# Patient Record
Sex: Female | Born: 1990 | Race: Black or African American | Hispanic: No | Marital: Single
Health system: Midwestern US, Community
[De-identification: ages and names within clinical notes are randomized; demographics above are authoritative.]

## PROBLEM LIST (undated history)

## (undated) DIAGNOSIS — O24419 Gestational diabetes mellitus in pregnancy, unspecified control: Secondary | ICD-10-CM

## (undated) HISTORY — DX: Gestational diabetes mellitus in pregnancy, unspecified control: O24.419

---
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
## 2010-09-21 NOTE — ED Provider Notes (Signed)
KNOWN ALLERGIES   NKDA   Seasonal (Unconfirmed)       TRIAGE Sheral Flow Sep 21, 2010 09:47 KPC0)   PATIENT: NAME: [name], AGE: 20, GENDER: female, DOB:         Thu 1991/03/03, TIME OF GREET: Mon Sep 21, 2010 09:46, LANGUAGE:         [place], [place]: [id number], KG WEIGHT: 49.9, HEIGHT: 167cm, MEDICAL         Gill NUMBER: [phone], ACCOUNT NUMBER: [id number], PCP: Doctor         Rene Paci,. [facility] Sep 21, 2010 09:47 KPC0)   ADMISSION: URGENCY: 3, AMBULANCE: [place] [facility] 1,         TRANSPORT: Ambulance, DEPT: Emergency, BED: 2ED 35. (Mon Sep 21, 2010         09:47 KPC0)   VITAL SIGNS: BP 110/58, (Lying), Pulse 80, Resp 18, Temp 99.1,         (Oral), Pain 6, O2 Sat 99, on Room air, Time 09/21/2010 09:47. (09:47         KPC0)   COMPLAINT:  Medic With Abd Pain. (Mon Sep 21, 2010 09:47         KPC0)   PRESENTING COMPLAINT:  Epigastric abdominal pain, Since Today.         (09:49 KPC0)   PAIN: Patient complains of pain, Pain described as aching, On a         scale 0-10 patient rates pain as 6, Epigastric, Pain is constant.         (09:49 KPC0)   LMP: Last menstrual period: 08-26-2010, Pt not on birth control.         (09:49 KPC0)   TREATMENT PRIOR TO ARRIVAL: None. (09:49 KPC0)   TB SCREENING: TB screen negative for this patient. (09:49         KPC0)   ABUSE SCREENING: Patient denies physical abuse or threats. (09:49         KPC0)   FALL RISK: Patient has a low risk of falling. (09:49 KPC0)   SUICIDAL IDEATION: Suicidal ideation is not present. (09:49         KPC0)   ADVANCE DIRECTIVES: Patient does not have advance directives,         Triage assessment performed. (09:49 KPC0)   PROVIDERS: TRIAGE NURSE: Cecil Cranker, RN. [facility] Sep 21, 2010 09:47 KPC0)   PREVIOUS VISIT ALLERGIES: Nkda, Seasonal. (Mon Sep 21, 2010 09:47         KPC0)       AMBULANCE (09:32 CML2)   AMBULANCE: Ambulance: Mon Sep 21, 2010 09:32.       CURRENT MEDICATIONS (09:47 KPC0)   Patient not taking meds       ORDERS    One Click Abdominal Pain:  Ordered for: Truddie Crumble, MD, Rob         Status: Done by Ephriam Knuckles, RN, Tomie China Sep 21, 2010 09:59.         (09:50 KPC0)   Urine HCG:  Ordered for: Truddie Crumble, MD, Rob         Status: Done by Ephriam Knuckles RN, Tomie China Sep 21, 2010 10:00.         (09:50 KPC0)   Urine dip (send for lab U/A if positive):  Ordered for:         Truddie Crumble, MD, Rob         Status: Done by Ephriam Knuckles, RN, Tomie China Sep 21, 2010 09:59.         (09:50 KPC0)   COMPREHENSIVE METABOLIC PANEL:  Ordered for: Truddie Crumble, MD, Rob         Status: Done by System Mon Sep 21, 2010 11:17. (10:34 ADB2)   LIPASE:  Ordered for: Truddie Crumble, MD, Rob         Status: Done by System Mon Sep 21, 2010 11:17. (10:34 ADB2)   CBC, AUTOMATED DIFFERENTIAL:  Ordered for: Truddie Crumble, MD, Rob         Status: Done by System Mon Sep 21, 2010 11:13. (10:34 ADB2)       NURSING ASSESSMENT: ABDOMEN (09:49 KPC0)   CONSTITUTIONAL PED: Complex assessment performed, Patient         arrives, via [facility], Chief complaint: Abdominal         pain, Skin warm, and dry, and normal in color.   PAIN: aching pain, to the epigastric region, Onset of pain This         morning, constant, on a scale 0-10 patient rates pain as 6.   ABDOMEN: Abdomen assessment findings include abdomen symmetrical,         Abdomen soft, tender, to the epigastric region, no pulsatile mass, no         associated nausea, no associated vomiting, Associated with diarrhea.   NOTES: Emotional support needed and given.   SAFETY: Side rails up, Cart/Stretcher in lowest position, Call         light within reach, Hospital ID band on.       NURSING PROCEDURE: DISCHARGE NOTE (11:34 KPC0)   DISCHARGE: Patient discharged to home, ambulating without         assistance, driving self, accompanied by husband/wife/partner,         Discharge instructions given to patient, Simple or moderate discharge         teaching performed, Above person(s) verbalized understanding of          discharge instructions and follow-up care.       DIAGNOSIS (11:24 ADB2)   FINAL: PRIMARY: Abdominal pain - epigastric.       DISPOSITION   PATIENT:  Disposition Type: Discharged, Disposition: Discharged,         Condition: Stable. (11:24 ADB2)      IV Infusion: N/A, Disposition Transport: Family/Friend drive, Patient         left the department. (11:42 KPC0)       INSTRUCTION (11:24 ADB2)   DISCHARGE:  ABDOMINAL PAIN [facility]).   FOLLOWUP:  [facility], , MEDICINE, Rayetta Humphrey,         MEDICINE, [address] [place] [address], [phone].   SPECIAL:  Follow up with primary care physician          Encourage plenty of fluids         Return to the ER if condition worsens or new symptoms develop.   Key:     ADB2=Blalock, PA-C, Morrie Sheldon  CML2=[place], RN, [employer]  KPC0=Christian, RN,     Natalia Leatherwood

## 2010-09-21 NOTE — ED Provider Notes (Signed)
[facility]   EMERGENCY DEPARTMENT TREATMENT REPORT   NAME:  Gill, Paula   SEX:   F   ADMIT: 09/21/2010   DOB:   1991/01/09   MR#    [id number]   ROOM:     TIME SEEN: 02 20 PM   ACCT#  [id number]               DATE OF SERVICE:    09/21/2010.       TIME OF EVALUATION:    0950.       CHIEF COMPLAINT:   Medic, with abdominal pain.       HISTORY OF PRESENT ILLNESS:   The patient is a 20 year old female brought in by medic from home with    complaint of constant sharp epigastric abdominal pain which started a few    hours ago around 0900 this morning.  The patient denies any exacerbating or    alleviating factors of the abdominal pain.  The patient notes she had some    diarrhea yesterday evening, approximately 2 episodes with no noted blood.  The    patient denies any associated fever, chills, nausea, vomiting.  The patient    notes last menstrual cycle was 06/20.  Menstrual cycle is normal occurring    once monthly.  The patient denies any urinary symptoms including dysuria,    urinary frequency or any abnormal vaginal discharge at this time.  The patient    notes that her abdominal pain has improved since her arrival to the ED and    then subsiding upon my evaluation.  The patient notes she has a history of    C-section, otherwise she is generally healthy.       REVIEW OF SYSTEMS:   CONSTITUTIONAL:  No fever or chills.   EYES: No visual symptoms.    ENT: No sore throat, runny nose or other URI symptoms.    RESPIRATORY:  No cough, shortness of breath, or wheezing.    CARDIOVASCULAR:  No chest pain, chest pressure, or palpitations.    GASTROINTESTINAL:  Epigastric abdominal pain, the patient is improving upon    arrival to the ED.  It started this morning and no associated nausea,    vomiting.  Report of diarrhea yesterday evening, normal bowel movement this    morning.   GENITOURINARY:  No dysuria, no frequency or abnormal vaginal discharge.   MUSCULOSKELETAL:  No body aches.   INTEGUMENTARY:  No rashes.    NEUROLOGIC:  No headaches.       PAST MEDICAL HISTORY:   History of C-section.       CURRENT MEDICATIONS:   Reviewed on Ibex.       FAMILY HISTORY:   Noncontributory to this case.       PHYSICAL EXAMINATION:   VITAL SIGNS:  Blood pressure is 110/58, pulse 80, respirations 18, temperature    99.1 orally.  Pain rated 6/10, O2 saturation 99% on room air.   GENERAL APPEARANCE:  Well-developed, well-nourished, in no distress.  The    patient is resting comfortably on examining bed as you enter the room.   HEENT: Eyes:  Conjunctivae clear, lids normal.  Pupils equal, symmetrical, and    normally reactive.   Mouth/Throat:  Surfaces of the pharynx, palate, and    tongue are pink, moist, and without lesions.      NECK:  Supple, nontender, symmetrical, no masses or JVD, trachea midline.     Thyroid not enlarged, nodular, or tender.  LYMPHATICS:  No cervical or submandibular lymphadenopathy palpated.    RESPIRATORY:  Clear and equal breath sounds.  No respiratory distress,    tachypnea, or accessory muscle use.    CARDIOVASCULAR:  Heart regular, without murmurs, gallops, rubs, or thrills.      DP pulses 2+ and equal bilaterally. No peripheral edema or significant    varicosities.    GI:  Abdomen soft, nontender, without complaint of pain to palpation.  No    hepatomegaly or splenomegaly. No abdominal or inguinal masses appreciated by    inspection or palpation.  Normoactive bowel sounds noted.   SKIN:  Warm and dry without rashes.      NEUROLOGIC:  The patient alert and oriented.  No deficits noted.       INITIAL IMPRESSION:   This is a 20 year old generally healthy female who presents for evaluation of    epigastric abdominal pain, again that is resolving at this time.  Her abdomen    is soft, nontender.  On my exam she is afebrile, her vitals are stable.  We    will obtain basic labs, CBC, CMP, lipase as well as a urinalysis, urine    pregnancy for further evaluation.       DIAGNOSTIC STUDIES:    Urine pregnancy is negative.  Urinalysis unremarkable.  CBC:  Hemoglobin 12.4,    otherwise unremarkable.  Lipase within normal limits 59.  CMP is    unremarkable.       EMERGENCY DEPARTMENT COURSE:   The patient remained stable throughout her stay in the ED with no new or    worsening symptoms.  I did reveal the results with the patient and being she    is almost asymptomatic at this time, I feel that she is stable for discharge    home and patient agrees.       FINAL DIAGNOSES:   1.  Abdominal pain.   2.  Epigastric pain.       DISPOSITION AND PLAN:      The patient discharged.  The patient stable for discharge home at this time.     Follow up with primary care physician, if she does not have one she can    follow up with Dr. Bethanne Ginger.  The patient encouraged to drink plenty of    fluids and return to the ED sooner for any new or worsening symptoms.  The    patient was personally evaluated by myself and Dr. Orma Flaming who agrees    with the above assessment and plan.           ___________________   Posey Pronto MD   Dictated By: Truddie Crumble. Cathlyn Parsons, PA-C   dh2   D:09/21/2010   T: 09/21/2010 22:07:50   [id number]

## 2011-03-26 NOTE — ED Notes (Signed)
PAtient states that she is having mid abdominal pain starting today. PAtient states that she has a whitish yellow discharge- PAtient states that she went to the clinic 4 weeks ago for STD that is when she found out she was pregnant-states that she does not know after taking the medications for the STD that it did not go completely away

## 2011-03-27 LAB — BETA HCG, QT
Beta HCG, QT: 273260 m[IU]/mL — ABNORMAL HIGH (ref 0–10)
hCG Quant: 273260 m[IU]/mL — ABNORMAL HIGH (ref 0–10)

## 2011-03-27 LAB — CBC WITH AUTOMATED DIFF
ABS. BASOPHILS: 0 10*3/uL (ref 0.0–0.06)
ABS. EOSINOPHILS: 0.1 10*3/uL (ref 0.0–0.4)
ABS. LYMPHOCYTES: 2.1 10*3/uL (ref 0.9–3.6)
ABS. MONOCYTES: 0.5 10*3/uL (ref 0.05–1.2)
ABS. NEUTROPHILS: 4.4 10*3/uL (ref 1.8–8.0)
BASOPHILS: 0 % (ref 0–2)
EOSINOPHILS: 2 % (ref 0–5)
HCT: 36.9 % (ref 35.0–45.0)
HGB: 12.7 g/dL (ref 12.0–16.0)
LYMPHOCYTES: 29 % (ref 21–52)
MCH: 28.7 PG (ref 24.0–34.0)
MCHC: 34.4 g/dL (ref 31.0–37.0)
MCV: 83.5 FL (ref 74.0–97.0)
MONOCYTES: 7 % (ref 3–10)
MPV: 10.3 FL (ref 9.2–11.8)
NEUTROPHILS: 62 % (ref 40–73)
PLATELET: 159 10*3/uL (ref 135–420)
RBC: 4.42 M/uL (ref 4.20–5.30)
RDW: 12.7 % (ref 11.6–14.5)
WBC: 7.1 10*3/uL (ref 4.6–13.2)

## 2011-03-27 LAB — METABOLIC PANEL, BASIC
Anion gap: 9 mmol/L (ref 3.0–18)
BUN/Creatinine ratio: 11 — ABNORMAL LOW (ref 12–20)
BUN: 7 MG/DL (ref 7.0–18)
CO2: 26 MMOL/L (ref 21–32)
Calcium: 8.9 MG/DL (ref 8.5–10.1)
Chloride: 104 MMOL/L (ref 100–108)
Creatinine: 0.61 MG/DL (ref 0.6–1.3)
GFR est AA: >60 mL/min/{1.73_m2} (ref >60)
GFR est non-AA: >60 mL/min/{1.73_m2} (ref >60)
Glucose: 80 MG/DL (ref 74–99)
Potassium: 3.7 MMOL/L (ref 3.5–5.5)
Sodium: 139 MMOL/L (ref 136–145)

## 2011-03-27 LAB — URINALYSIS W/ RFLX MICROSCOPIC
Bilirubin: NEGATIVE
Blood: NEGATIVE
Glucose: NEGATIVE MG/DL
Ketone: NEGATIVE MG/DL
Leukocyte Esterase: NEGATIVE
Nitrites: NEGATIVE
Protein: NEGATIVE MG/DL
Specific gravity: 1.015 (ref 1.003–1.030)
Urobilinogen: 0.2 EU/DL (ref 0.2–1.0)
pH (UA): 7 (ref 5.0–8.0)

## 2011-03-27 LAB — HEPATIC FUNCTION PANEL
A-G Ratio: 1.2 (ref 0.8–1.7)
ALT (SGPT): 15 U/L (ref 12.0–78.0)
AST (SGOT): 10 U/L — ABNORMAL LOW (ref 15–37)
Albumin: 3.8 g/dL (ref 3.4–5.0)
Alk. phosphatase: 59 U/L (ref 50–136)
Bilirubin, direct: 0.1 MG/DL (ref 0.0–0.2)
Bilirubin, total: 0.3 MG/DL (ref 0.2–1.0)
Globulin: 3.2 g/dL (ref 2.0–4.0)
Protein, total: 7 g/dL (ref 6.4–8.2)

## 2011-03-27 LAB — WET PREP

## 2011-03-27 LAB — LIPASE: Lipase: 81 U/L (ref 73–393)

## 2011-03-27 MED ORDER — PRENATAL MULTIVIT-MIN-FE-FA 1 MG CAPSULE
1 mg | ORAL_CAPSULE | Freq: Every day | ORAL | Status: DC
Start: 2011-03-27 — End: 2014-10-02

## 2011-03-27 MED ORDER — PROMETHAZINE 25 MG TAB
25 mg | ORAL_TABLET | Freq: Four times a day (QID) | ORAL | Status: DC | PRN
Start: 2011-03-27 — End: 2014-10-02

## 2011-03-27 MED ORDER — ONDANSETRON (PF) 4 MG/2 ML INJECTION
4 mg/2 mL | INTRAMUSCULAR | Status: AC
Start: 2011-03-27 — End: 2011-03-27
  Administered 2011-03-27: 06:00:00 via INTRAVENOUS

## 2011-03-27 MED ADMIN — sodium chloride 0.9 % bolus infusion 1,000 mL: INTRAVENOUS | @ 06:00:00 | NDC 00409798309

## 2011-03-27 MED FILL — ONDANSETRON (PF) 4 MG/2 ML INJECTION: 4 mg/2 mL | INTRAMUSCULAR | Qty: 2

## 2011-03-27 NOTE — ED Notes (Signed)
Dr. Kemper Durie in with pt at this time. Awaiting further orders.

## 2011-03-27 NOTE — ED Notes (Signed)
Dr. Kemper Durie in with pt at this time. Awaiting disposition.

## 2011-03-27 NOTE — ED Provider Notes (Signed)
HPI Comments: 21 yo 3 mo gravid F presents to the ED c/o sharp and intermittent ABD pain onset today.  Pt also states that she has had N/V x 1 month.  Pt states that she had a pregnancy test done at the clinic and was told her due date based on her LMP.  Pt has not followed up with OB/GYN.  Pt's grava:  2, para:1, AB: 0.  Pt has NKMA.  PT denies smoking and ETOH use. PT has FHX of DM and HTN.  Pt denies burning on urination, frequency, vaginal bleeding, diarrhea, CP, SOB, fever, chills, and any other sx or complaints.    Patient is a 21 y.o. female presenting with abdominal pain.   Abdominal Pain   Associated symptoms include nausea and vomiting.        No past medical history on file.     Past Surgical History   Procedure Date   ??? Hx cesarean section          No family history on file.     History     Social History   ??? Marital Status: SINGLE     Spouse Name: N/A     Number of Children: N/A   ??? Years of Education: N/A     Occupational History   ??? Not on file.     Social History Main Topics   ??? Smoking status: Never Smoker    ??? Smokeless tobacco: Not on file   ??? Alcohol Use: No   ??? Drug Use: No   ??? Sexually Active: Yes -- Female partner(s)     Birth Control/ Protection: None     Other Topics Concern   ??? Not on file     Social History Narrative   ??? No narrative on file                  ALLERGIES: Review of patient's allergies indicates no known allergies.      Review of Systems   Gastrointestinal: Positive for nausea, vomiting and abdominal pain.   All other systems reviewed and are negative.        Filed Vitals:    03/26/11 2334   BP: 120/68   Pulse: 66   Temp: 99 ??F (37.2 ??C)   Resp: 15   Height: 5\' 6"  (1.676 m)   Weight: 48.988 kg (108 lb)   SpO2: 98%            Physical Exam   Nursing note and vitals reviewed.  Constitutional: She is oriented to person, place, and time. She appears well-developed and well-nourished. No distress.        21 year old African American in no acute distress.   HENT:   Head: Normocephalic  and atraumatic.   Right Ear: External ear normal.   Left Ear: External ear normal.   Nose: Nose normal.   Mouth/Throat: Oropharynx is clear and moist. No oropharyngeal exudate.   Eyes: Conjunctivae and EOM are normal. Pupils are equal, round, and reactive to light. Right eye exhibits no discharge. Left eye exhibits no discharge. No scleral icterus.   Neck: Normal range of motion. Neck supple. No JVD present. No tracheal deviation present. No thyromegaly present.   Cardiovascular: Normal rate, regular rhythm, normal heart sounds and intact distal pulses.  Exam reveals no gallop and no friction rub.    No murmur heard.  Pulmonary/Chest: Effort normal and breath sounds normal. No stridor. No respiratory distress. She has no wheezes.  She has no rales. She exhibits no tenderness.   Abdominal: Soft. Bowel sounds are normal. She exhibits no distension and no mass. There is no tenderness. There is no rebound and no guarding.   Musculoskeletal: Normal range of motion. She exhibits no edema and no tenderness.   Lymphadenopathy:     She has no cervical adenopathy.   Neurological: She is alert and oriented to person, place, and time. She has normal reflexes. No cranial nerve deficit.   Skin: Skin is warm and dry. No rash noted. She is not diaphoretic. No erythema. No pallor.   Psychiatric: She has a normal mood and affect. Her behavior is normal. Judgment normal.        MDM     Differential Diagnosis; Clinical Impression; Plan:     Differential includes:  Pregnancy, Gastritis, GERD, UTI, Vaginitis, Hyperemesis.  Amount and/or Complexity of Data Reviewed:   Clinical lab tests:  Ordered   Decide to obtain previous medical records or to obtain history from someone other than the patient:  Yes   Review and summarize past medical records:  Yes  Risk of Significant Complications, Morbidity, and/or Mortality:   Presenting problems:  Moderate  Diagnostic procedures:  Moderate  Management options:  Moderate      Procedures  Bedside  transabdominal U/S performed by Dr. Kemper Durie.  Revealed an IUP, HR 140's, Patient informed that she must follow up with OB/GYN to have prenatal care and formal U/S study.     3:47 AM  PROGRESS NOTE  Dr. Llana Aliment, DO reviewed lab results and medications with the patient. The patient is feeling better. Dr. Llana Aliment, DO answered the patient's questions. The patient is ready to be discharged home.    Diagnosis:   1. Vomiting pregnancy    2. Nausea with vomiting          Disposition: Home    Follow-up Information     Follow up With Details Comments Contact Info    Wallace Keller, MD Schedule an appointment as soon as possible for a visit  [address]  [place] [address]  [phone]            Patient's Medications   Start Taking    PRENATAL VITS W-CA,FE,FA,1MG , (PRENATAL MULTIVIT-MIN-FE-FA) 1 MG CAP    Take 1 mg by mouth daily.    PROMETHAZINE (PHENERGAN) 25 MG TABLET    Take 1 Tab by mouth every six (6) hours as needed for Nausea. Caution:  This medication may make you drowsy.  Avoid driving while under the influence of this medication.   Continue Taking    PRENATAL VIT-FE FUM-FA-DSS (PRENATAL 19) 29-1 MG TAB    Take  by mouth.     These Medications have changed    No medications on file   Stop Taking    No medications on file           12:32 AM  Provider documentation is written by Avelina Laine acting as a scribe for Dr. Llana Aliment, DO.  I have reviewed the information documented by the Scribe and agree with its contents:  Dr. Llana Aliment, DO.

## 2011-03-27 NOTE — ED Notes (Signed)
I have reviewed discharge instructions with the patient.  The patient verbalized understanding. Pt amb out of ed with mother driving . Denies further needs/requests upon d/c.

## 2011-03-30 LAB — CHLAMYDIA/GC PCR
Chlamydia amplified: NEGATIVE
N. gonorrhea, amplified: NEGATIVE

## 2014-10-02 ENCOUNTER — Inpatient Hospital Stay
Admit: 2014-10-02 | Discharge: 2014-10-02 | Disposition: A | Discharge status: Home / Self Care | Payer: MEDICAID | Attending: Emergency Medicine

## 2014-10-02 DIAGNOSIS — M545 Low back pain: Secondary | ICD-10-CM

## 2014-10-02 MED ORDER — HYDROCODONE-ACETAMINOPHEN 5 MG-325 MG TAB
5-325 mg | ORAL_TABLET | ORAL | Status: AC
Start: 2014-10-02 — End: ?

## 2014-10-02 MED ORDER — CYCLOBENZAPRINE 10 MG TAB
10 mg | ORAL_TABLET | Freq: Three times a day (TID) | ORAL | Status: AC | PRN
Start: 2014-10-02 — End: ?
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
## 2014-10-02 NOTE — ED Notes (Signed)
Discharge instructions given to patient, patient verbalizes understanding of instructions. Patient alert and oriented x3, denies pain or shortness of breath at this time. Patient ambulatory, gait steady.   Patient armband removed and given to patient to take home.  Patient was informed of the privacy risks if armband lost or stolen.
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
## 2017-04-01 ENCOUNTER — Encounter: Attending: Urology | Primary: Student in an Organized Health Care Education/Training Program

## 2019-04-10 ENCOUNTER — Other Ambulatory Visit: Payer: Self-pay

## 2019-08-09 ENCOUNTER — Other Ambulatory Visit: Payer: Self-pay | Admitting: Obstetrics and Gynecology

## 2019-08-09 ENCOUNTER — Other Ambulatory Visit: Payer: Self-pay | Admitting: Obstetrics & Gynecology

## 2019-08-09 DIAGNOSIS — Z3482 Encounter for supervision of other normal pregnancy, second trimester: Secondary | ICD-10-CM

## 2019-08-29 ENCOUNTER — Ambulatory Visit: Payer: Medicaid Other

## 2019-08-31 ENCOUNTER — Encounter: Payer: Self-pay | Admitting: *Deleted

## 2019-09-04 ENCOUNTER — Other Ambulatory Visit: Payer: Self-pay | Admitting: *Deleted

## 2019-09-04 ENCOUNTER — Ambulatory Visit: Payer: Medicaid Other | Attending: Obstetrics & Gynecology

## 2019-09-04 ENCOUNTER — Other Ambulatory Visit: Payer: Self-pay | Admitting: Obstetrics & Gynecology

## 2019-09-04 ENCOUNTER — Other Ambulatory Visit: Payer: Self-pay

## 2019-09-04 DIAGNOSIS — Z3482 Encounter for supervision of other normal pregnancy, second trimester: Secondary | ICD-10-CM

## 2019-09-04 DIAGNOSIS — O34219 Maternal care for unspecified type scar from previous cesarean delivery: Secondary | ICD-10-CM

## 2019-09-04 DIAGNOSIS — Z3A31 31 weeks gestation of pregnancy: Secondary | ICD-10-CM

## 2019-09-04 DIAGNOSIS — Z862 Personal history of diseases of the blood and blood-forming organs and certain disorders involving the immune mechanism: Secondary | ICD-10-CM | POA: Diagnosis not present

## 2019-09-04 DIAGNOSIS — O0933 Supervision of pregnancy with insufficient antenatal care, third trimester: Secondary | ICD-10-CM | POA: Diagnosis not present

## 2019-09-04 DIAGNOSIS — Z363 Encounter for antenatal screening for malformations: Secondary | ICD-10-CM

## 2019-09-04 DIAGNOSIS — Z362 Encounter for other antenatal screening follow-up: Secondary | ICD-10-CM

## 2019-09-26 ENCOUNTER — Encounter: Payer: Medicaid Other | Attending: Obstetrics & Gynecology | Admitting: Registered"

## 2019-10-02 ENCOUNTER — Ambulatory Visit: Payer: Medicaid Other

## 2019-10-02 ENCOUNTER — Encounter: Payer: Self-pay | Admitting: *Deleted

## 2019-10-02 ENCOUNTER — Ambulatory Visit: Payer: Medicaid Other | Attending: Obstetrics and Gynecology

## 2019-10-02 ENCOUNTER — Ambulatory Visit: Payer: Medicaid Other | Admitting: *Deleted

## 2019-10-02 ENCOUNTER — Other Ambulatory Visit: Payer: Self-pay

## 2019-10-02 VITALS — BP 122/63 | HR 97 | Ht 65.0 in

## 2019-10-02 DIAGNOSIS — O24410 Gestational diabetes mellitus in pregnancy, diet controlled: Secondary | ICD-10-CM

## 2019-10-02 DIAGNOSIS — Z3A34 34 weeks gestation of pregnancy: Secondary | ICD-10-CM

## 2019-10-02 DIAGNOSIS — Z862 Personal history of diseases of the blood and blood-forming organs and certain disorders involving the immune mechanism: Secondary | ICD-10-CM

## 2019-10-02 DIAGNOSIS — O0933 Supervision of pregnancy with insufficient antenatal care, third trimester: Secondary | ICD-10-CM | POA: Diagnosis not present

## 2019-10-02 DIAGNOSIS — Z362 Encounter for other antenatal screening follow-up: Secondary | ICD-10-CM | POA: Diagnosis not present

## 2019-10-02 DIAGNOSIS — O34219 Maternal care for unspecified type scar from previous cesarean delivery: Secondary | ICD-10-CM | POA: Diagnosis not present

## 2019-10-02 NOTE — Progress Notes (Signed)
Patient states her EDD by MD is 11/08/19.

## 2019-10-06 ENCOUNTER — Inpatient Hospital Stay (HOSPITAL_COMMUNITY)
Admission: AD | Admit: 2019-10-06 | Discharge: 2019-10-07 | Disposition: A | Discharge status: Home / Self Care | Payer: Medicaid Other | Attending: Obstetrics & Gynecology | Admitting: Obstetrics & Gynecology

## 2019-10-06 ENCOUNTER — Other Ambulatory Visit: Payer: Self-pay

## 2019-10-06 DIAGNOSIS — O4703 False labor before 37 completed weeks of gestation, third trimester: Secondary | ICD-10-CM | POA: Insufficient documentation

## 2019-10-06 DIAGNOSIS — Z3A35 35 weeks gestation of pregnancy: Secondary | ICD-10-CM | POA: Insufficient documentation

## 2019-10-06 DIAGNOSIS — Z3689 Encounter for other specified antenatal screening: Secondary | ICD-10-CM

## 2019-10-06 NOTE — ED Triage Notes (Signed)
Patient reports sudden onset abdominal contractions this evening , she is [redacted] weeks pregnant G4P3 , denies vaginal bleeding or discharge , PA evaluated patient and advised RN that pt. can be transported to MAU.

## 2019-10-06 NOTE — ED Provider Notes (Signed)
MSE was initiated and I personally evaluated the patient and placed orders (if any) at  11:07 PM on October 06, 2019.  The patient appears stable so that the remainder of the MSE may be completed by another provider.  I was called to triage this patient by RN Molly Maduro.  29 year old awaiting scheduled cesarean section next month currently [redacted] weeks pregnant with fourth child presents for contractions onset 9 pm. Reportedly every 2 minutes, moderately uncomfortable.  No pelvic pressure, urge to push, LOF or vaginal bleeding, abnormal discharge.  No fever. No CP or SOB.  OB [place]. Fetus moving normally today. No apparent imminent delivery.   Patient speaking calmly in no acute distress. Gravid abdomen, non tender.   Appropriate for MAU evaluation.    Liberty Handy, PA-C 10/06/19 2309    Virgina Norfolk, DO 10/06/19 2315

## 2019-10-07 ENCOUNTER — Encounter (HOSPITAL_COMMUNITY): Payer: Self-pay | Admitting: Obstetrics & Gynecology

## 2019-10-07 DIAGNOSIS — Z3A35 35 weeks gestation of pregnancy: Secondary | ICD-10-CM | POA: Diagnosis not present

## 2019-10-07 DIAGNOSIS — O4703 False labor before 37 completed weeks of gestation, third trimester: Secondary | ICD-10-CM

## 2019-10-07 LAB — URINALYSIS, ROUTINE W REFLEX MICROSCOPIC
Bilirubin Urine: NEGATIVE
Glucose, UA: NEGATIVE mg/dL
Hgb urine dipstick: NEGATIVE
Ketones, ur: 80 mg/dL — AB
Leukocytes,Ua: NEGATIVE
Nitrite: NEGATIVE
Protein, ur: NEGATIVE mg/dL
Specific Gravity, Urine: 1.013 (ref 1.005–1.030)
pH: 5 (ref 5.0–8.0)

## 2019-10-07 LAB — WET PREP, GENITAL
Clue Cells Wet Prep HPF POC: NONE SEEN
Sperm: NONE SEEN
Trich, Wet Prep: NONE SEEN
Yeast Wet Prep HPF POC: NONE SEEN

## 2019-10-07 NOTE — Discharge Instructions (Signed)

## 2019-10-07 NOTE — MAU Provider Note (Signed)
First Provider Initiated Contact with Patient 10/07/19 0036       S: Ms. Brianna Mahoney is a 29 y.o. [phone] at [redacted]w[redacted]d  who presents to MAU today complaining contractions q 2-3 minutes since 2100. She denies vaginal bleeding. She denies LOF. She reports normal fetal movement.    O: BP (!) 117/62    Pulse 87    Temp 98.2 F (36.8 C) (Oral)    LMP 02/07/2019 (Approximate)    SpO2 99%  GENERAL: Well-developed, well-nourished female in no acute distress.  HEAD: Normocephalic, atraumatic.  CHEST: Normal effort of breathing, regular heart rate ABDOMEN: Soft, nontender, gravid  Cervical exam:  Dilation: Closed Cervical Position: Posterior Exam by:: Gerrit Heck, CNM   Fetal Monitoring: Baseline: 135 Variability: Moderate Accelerations: Present, 15x15 Decelerations: None Contractions: Irritability, palpates mild   A: SIUP at [redacted]w[redacted]d  Contractions  NST Reactive Repeat C/S  P: Will monitor and recheck in one hour. Patient agreeable and without questions or concerns. Wet prep sent and pending.   Gerrit Heck, CNM 10/07/2019 12:36 AM  Reassessment (1:39 AM)  -Wp returns negative. -Provider to bedside to reassess. -Patient declines VE reporting that the contractions have decreased and that she feels no cervical change has occurred. -Reassured that she can refuse and that provider will discharge. -Patient to follow up with primary ob as scheduled. -Encouraged to call or return to MAU if symptoms worsen or with the onset of new symptoms. -Discharged to home in stble condition.  Cherre Robins MSN, CNM Advanced Practice Provider, [facility]

## 2019-10-07 NOTE — MAU Note (Signed)
.  Brianna Mahoney is a 29 y.o. at [redacted]w[redacted]d here in MAU reporting: CTX every 2-3 minutes since 2100 on 7/31/2. +FM. Denies vaginal bleeding or LOF   Pain score: 7/10 Vitals:   10/06/19 2342 10/06/19 2343  BP:  (!) 117/62  Pulse:  87  Temp: 98.2 F (36.8 C)   SpO2: 99%      FHT: monitors applied

## 2019-10-10 ENCOUNTER — Encounter: Payer: Medicaid Other | Attending: Obstetrics & Gynecology | Admitting: Registered"

## 2019-10-12 DIAGNOSIS — O24419 Gestational diabetes mellitus in pregnancy, unspecified control: Secondary | ICD-10-CM | POA: Insufficient documentation

## 2019-10-16 ENCOUNTER — Encounter (HOSPITAL_COMMUNITY): Payer: Self-pay

## 2019-10-16 NOTE — Patient Instructions (Signed)
Brianna Mahoney  10/16/2019   Your procedure is scheduled on:  11/01/2019  Arrive at 1115 at Entrance C on [address] at [facility]  and [employer]. You are invited to use the FREE valet parking or use the Visitor's parking deck.  Pick up the phone at the desk and dial [phone].  Call this number if you have problems the morning of surgery: [phone]  Remember:   Do not eat food:(After Midnight) Desps de medianoche.  Do not drink clear liquids: (After Midnight) Desps de medianoche.  Take these medicines the morning of surgery with A SIP OF WATER:  none   Do not wear jewelry, make-up or nail polish.  Do not wear lotions, powders, or perfumes. Do not wear deodorant.  Do not shave 48 hours prior to surgery.  Do not bring valuables to the hospital.  [facility] is not   responsible for any belongings or valuables brought to the hospital.  Contacts, dentures or bridgework may not be worn into surgery.  Leave suitcase in the car. After surgery it may be brought to your room.  For patients admitted to the hospital, checkout time is 11:00 AM the day of              discharge.      Please read over the following fact sheets that you were given:     Preparing for Surgery

## 2019-10-30 ENCOUNTER — Other Ambulatory Visit (HOSPITAL_COMMUNITY)
Admission: RE | Admit: 2019-10-30 | Discharge: 2019-10-30 | Disposition: A | Discharge status: Home / Self Care | Payer: Medicaid Other | Source: Ambulatory Visit | Attending: Obstetrics & Gynecology | Admitting: Obstetrics & Gynecology

## 2019-10-30 ENCOUNTER — Other Ambulatory Visit: Payer: Self-pay

## 2019-10-30 DIAGNOSIS — Z01812 Encounter for preprocedural laboratory examination: Secondary | ICD-10-CM | POA: Diagnosis present

## 2019-10-30 DIAGNOSIS — Z20822 Contact with and (suspected) exposure to covid-19: Secondary | ICD-10-CM | POA: Insufficient documentation

## 2019-10-30 LAB — TYPE AND SCREEN
ABO/RH(D): O POS
Antibody Screen: NEGATIVE

## 2019-10-30 LAB — CBC
HCT: 29.9 % — ABNORMAL LOW (ref 36.0–46.0)
Hemoglobin: 9.5 g/dL — ABNORMAL LOW (ref 12.0–15.0)
MCH: 25.6 pg — ABNORMAL LOW (ref 26.0–34.0)
MCHC: 31.8 g/dL (ref 30.0–36.0)
MCV: 80.6 fL (ref 80.0–100.0)
Platelets: 212 10*3/uL (ref 150–400)
RBC: 3.71 MIL/uL — ABNORMAL LOW (ref 3.87–5.11)
RDW: 13.9 % (ref 11.5–15.5)
WBC: 6.5 10*3/uL (ref 4.0–10.5)
nRBC: 0 % (ref 0.0–0.2)

## 2019-10-30 LAB — SARS CORONAVIRUS 2 (TAT 6-24 HRS): SARS Coronavirus 2: NEGATIVE

## 2019-10-30 LAB — RPR: RPR Ser Ql: NONREACTIVE

## 2019-10-30 NOTE — MAU Note (Signed)
Pt here for labs and covid swab. Denies symptoms or sick contacts. Swab collected. 

## 2019-10-31 NOTE — Anesthesia Preprocedure Evaluation (Addendum)
Anesthesia Evaluation  Patient identified by MRN, date of birth, ID band Patient awake    Reviewed: Allergy & Precautions, NPO status , Patient's Chart, lab work & pertinent test results  History of Anesthesia Complications Negative for: history of anesthetic complications  Airway Mallampati: II  TM Distance: >3 FB Neck ROM: Full    Dental no notable dental hx.    Pulmonary neg pulmonary ROS,    Pulmonary exam normal        Cardiovascular negative cardio ROS Normal cardiovascular exam     Neuro/Psych negative neurological ROS  negative psych ROS   GI/Hepatic negative GI ROS, Neg liver ROS,   Endo/Other  diabetes, Gestational  Renal/GU negative Renal ROS  negative genitourinary   Musculoskeletal negative musculoskeletal ROS (+)   Abdominal   Peds  Hematology  (+) anemia , Hgb 9.5, plt 212   Anesthesia Other Findings Day of surgery medications reviewed with patient.  Reproductive/Obstetrics (+) Pregnancy (Hx of C/S x3)                            Anesthesia Physical Anesthesia Plan  ASA: III  Anesthesia Plan: Combined Spinal and Epidural   Post-op Pain Management:    Induction:   PONV Risk Score and Plan: 4 or greater and Treatment may vary due to age or medical condition, Ondansetron and Dexamethasone  Airway Management Planned: Natural Airway  Additional Equipment: None  Intra-op Plan:   Post-operative Plan:   Informed Consent: I have reviewed the patients History and Physical, chart, labs and discussed the procedure including the risks, benefits and alternatives for the proposed anesthesia with the patient or authorized representative who has indicated his/her understanding and acceptance.       Plan Discussed with: CRNA  Anesthesia Plan Comments:        Anesthesia Quick Evaluation

## 2019-11-01 ENCOUNTER — Inpatient Hospital Stay (HOSPITAL_COMMUNITY)
Admission: RE | Admit: 2019-11-01 | Discharge: 2019-11-03 | DRG: 785 | Disposition: A | Discharge status: Home / Self Care | Payer: Medicaid Other | Attending: Obstetrics & Gynecology | Admitting: Obstetrics & Gynecology

## 2019-11-01 ENCOUNTER — Inpatient Hospital Stay (HOSPITAL_COMMUNITY): Payer: Medicaid Other | Admitting: Anesthesiology

## 2019-11-01 ENCOUNTER — Encounter (HOSPITAL_COMMUNITY)
Admission: RE | Disposition: A | Discharge status: Home / Self Care | Payer: Self-pay | Source: Home / Self Care | Attending: Obstetrics & Gynecology

## 2019-11-01 ENCOUNTER — Encounter (HOSPITAL_COMMUNITY): Payer: Self-pay | Admitting: Obstetrics and Gynecology

## 2019-11-01 ENCOUNTER — Other Ambulatory Visit: Payer: Self-pay

## 2019-11-01 DIAGNOSIS — Z98891 History of uterine scar from previous surgery: Secondary | ICD-10-CM

## 2019-11-01 DIAGNOSIS — D573 Sickle-cell trait: Secondary | ICD-10-CM | POA: Diagnosis present

## 2019-11-01 DIAGNOSIS — O34211 Maternal care for low transverse scar from previous cesarean delivery: Secondary | ICD-10-CM | POA: Diagnosis present

## 2019-11-01 DIAGNOSIS — Z302 Encounter for sterilization: Secondary | ICD-10-CM | POA: Diagnosis not present

## 2019-11-01 DIAGNOSIS — Z3A39 39 weeks gestation of pregnancy: Secondary | ICD-10-CM | POA: Diagnosis not present

## 2019-11-01 DIAGNOSIS — O24420 Gestational diabetes mellitus in childbirth, diet controlled: Secondary | ICD-10-CM | POA: Diagnosis present

## 2019-11-01 DIAGNOSIS — D509 Iron deficiency anemia, unspecified: Secondary | ICD-10-CM | POA: Diagnosis present

## 2019-11-01 DIAGNOSIS — O9902 Anemia complicating childbirth: Secondary | ICD-10-CM | POA: Diagnosis present

## 2019-11-01 LAB — GLUCOSE, CAPILLARY
Glucose-Capillary: 72 mg/dL (ref 70–99)
Glucose-Capillary: 82 mg/dL (ref 70–99)

## 2019-11-01 SURGERY — Surgical Case
Anesthesia: Spinal | Laterality: Bilateral

## 2019-11-01 MED ORDER — DIBUCAINE (PERIANAL) 1 % EX OINT: 1.0000 "application " | TOPICAL_OINTMENT | CUTANEOUS | Status: DC | PRN

## 2019-11-01 MED ORDER — ACETAMINOPHEN 325 MG PO TABS
650.0000 mg | ORAL_TABLET | ORAL | Status: DC | PRN
  Administered 2019-11-01 – 2019-11-03 (×5): 650 mg via ORAL
  Filled 2019-11-01 (×6): qty 2

## 2019-11-01 MED ORDER — ZOLPIDEM TARTRATE 5 MG PO TABS: 5.0000 mg | ORAL_TABLET | Freq: Every evening | ORAL | Status: DC | PRN

## 2019-11-01 MED ORDER — BUPIVACAINE IN DEXTROSE 0.75-8.25 % IT SOLN
INTRATHECAL | Status: AC
  Filled 2019-11-01: qty 2

## 2019-11-01 MED ORDER — KETOROLAC TROMETHAMINE 30 MG/ML IJ SOLN
INTRAMUSCULAR | Status: AC
  Filled 2019-11-01: qty 1

## 2019-11-01 MED ORDER — FENTANYL CITRATE (PF) 100 MCG/2ML IJ SOLN
INTRAMUSCULAR | Status: DC | PRN
  Administered 2019-11-01: 85 ug via INTRAVENOUS

## 2019-11-01 MED ORDER — LACTATED RINGERS IV SOLN: INTRAVENOUS | Status: DC

## 2019-11-01 MED ORDER — OXYCODONE-ACETAMINOPHEN 5-325 MG PO TABS: 1.0000 | ORAL_TABLET | ORAL | Status: DC | PRN

## 2019-11-01 MED ORDER — DEXAMETHASONE SODIUM PHOSPHATE 4 MG/ML IJ SOLN
INTRAMUSCULAR | Status: DC | PRN
  Administered 2019-11-01: 4 mg via INTRAVENOUS

## 2019-11-01 MED ORDER — IBUPROFEN 600 MG PO TABS
600.0000 mg | ORAL_TABLET | Freq: Four times a day (QID) | ORAL | Status: DC | PRN
  Administered 2019-11-02 – 2019-11-03 (×3): 600 mg via ORAL
  Filled 2019-11-01 (×4): qty 1

## 2019-11-01 MED ORDER — SODIUM CHLORIDE 0.9 % IR SOLN
Status: DC | PRN
  Administered 2019-11-01: 1

## 2019-11-01 MED ORDER — SENNOSIDES-DOCUSATE SODIUM 8.6-50 MG PO TABS
2.0000 | ORAL_TABLET | ORAL | Status: DC
  Administered 2019-11-02 – 2019-11-03 (×2): 2 via ORAL
  Filled 2019-11-01 (×3): qty 2

## 2019-11-01 MED ORDER — HYDROXYZINE HCL 25 MG PO TABS
25.0000 mg | ORAL_TABLET | Freq: Four times a day (QID) | ORAL | Status: DC | PRN
  Administered 2019-11-02 (×2): 25 mg via ORAL
  Filled 2019-11-01 (×2): qty 1

## 2019-11-01 MED ORDER — STERILE WATER FOR IRRIGATION IR SOLN
Status: DC | PRN
  Administered 2019-11-01: 1

## 2019-11-01 MED ORDER — PHENYLEPHRINE HCL-NACL 20-0.9 MG/250ML-% IV SOLN
INTRAVENOUS | Status: DC | PRN
  Administered 2019-11-01: 60 ug/min via INTRAVENOUS

## 2019-11-01 MED ORDER — PHENYLEPHRINE HCL-NACL 20-0.9 MG/250ML-% IV SOLN
INTRAVENOUS | Status: AC
  Filled 2019-11-01: qty 250

## 2019-11-01 MED ORDER — CHLOROPROCAINE HCL (PF) 3 % IJ SOLN
INTRAMUSCULAR | Status: AC
  Filled 2019-11-01: qty 20

## 2019-11-01 MED ORDER — DEXMEDETOMIDINE (PRECEDEX) IN NS 20 MCG/5ML (4 MCG/ML) IV SYRINGE
PREFILLED_SYRINGE | INTRAVENOUS | Status: AC
  Filled 2019-11-01: qty 5

## 2019-11-01 MED ORDER — FENTANYL CITRATE (PF) 100 MCG/2ML IJ SOLN
25.0000 ug | INTRAMUSCULAR | Status: DC | PRN
  Administered 2019-11-01: 25 ug via INTRAVENOUS

## 2019-11-01 MED ORDER — LIDOCAINE HCL (PF) 2 % IJ SOLN
INTRAMUSCULAR | Status: AC
  Filled 2019-11-01: qty 5

## 2019-11-01 MED ORDER — SIMETHICONE 80 MG PO CHEW: 80.0000 mg | CHEWABLE_TABLET | ORAL | Status: DC | PRN

## 2019-11-01 MED ORDER — KETOROLAC TROMETHAMINE 30 MG/ML IJ SOLN
30.0000 mg | Freq: Four times a day (QID) | INTRAMUSCULAR | Status: AC | PRN
  Administered 2019-11-01 – 2019-11-02 (×2): 30 mg via INTRAVENOUS
  Filled 2019-11-01 (×2): qty 1

## 2019-11-01 MED ORDER — OXYTOCIN-SODIUM CHLORIDE 30-0.9 UT/500ML-% IV SOLN: 2.5000 [IU]/h | INTRAVENOUS | Status: AC

## 2019-11-01 MED ORDER — ONDANSETRON HCL 4 MG/2ML IJ SOLN
INTRAMUSCULAR | Status: DC | PRN
  Administered 2019-11-01: 4 mg via INTRAVENOUS

## 2019-11-01 MED ORDER — KETOROLAC TROMETHAMINE 30 MG/ML IJ SOLN
30.0000 mg | Freq: Once | INTRAMUSCULAR | Status: AC
  Administered 2019-11-01: 30 mg via INTRAVENOUS

## 2019-11-01 MED ORDER — PROMETHAZINE HCL 25 MG/ML IJ SOLN: 6.2500 mg | INTRAMUSCULAR | Status: DC | PRN

## 2019-11-01 MED ORDER — OXYTOCIN-SODIUM CHLORIDE 30-0.9 UT/500ML-% IV SOLN
INTRAVENOUS | Status: DC | PRN
  Administered 2019-11-01: 300 mL via INTRAVENOUS

## 2019-11-01 MED ORDER — CEFAZOLIN SODIUM-DEXTROSE 2-4 GM/100ML-% IV SOLN
2.0000 g | INTRAVENOUS | Status: AC
  Administered 2019-11-01: 2 g via INTRAVENOUS

## 2019-11-01 MED ORDER — MORPHINE SULFATE (PF) 0.5 MG/ML IJ SOLN
INTRAMUSCULAR | Status: DC | PRN
Start: 2019-11-01 — End: 2019-11-01
  Administered 2019-11-01: .15 mg via INTRATHECAL

## 2019-11-01 MED ORDER — COCONUT OIL OIL
1.0000 "application " | TOPICAL_OIL | Status: DC | PRN
  Administered 2019-11-02: 1 via TOPICAL

## 2019-11-01 MED ORDER — SIMETHICONE 80 MG PO CHEW
80.0000 mg | CHEWABLE_TABLET | Freq: Three times a day (TID) | ORAL | Status: DC
  Administered 2019-11-01 – 2019-11-03 (×4): 80 mg via ORAL
  Filled 2019-11-01 (×5): qty 1

## 2019-11-01 MED ORDER — FENTANYL CITRATE (PF) 100 MCG/2ML IJ SOLN
INTRAMUSCULAR | Status: AC
  Filled 2019-11-01: qty 2

## 2019-11-01 MED ORDER — MORPHINE SULFATE (PF) 0.5 MG/ML IJ SOLN
INTRAMUSCULAR | Status: AC
  Filled 2019-11-01: qty 10

## 2019-11-01 MED ORDER — PRENATAL MULTIVITAMIN CH
1.0000 | ORAL_TABLET | Freq: Every day | ORAL | Status: DC
  Filled 2019-11-01: qty 1

## 2019-11-01 MED ORDER — ACETAMINOPHEN 500 MG PO TABS: 1000.0000 mg | ORAL_TABLET | Freq: Once | ORAL | Status: DC

## 2019-11-01 MED ORDER — CHLOROPROCAINE HCL (PF) 3 % IJ SOLN
INTRAMUSCULAR | Status: DC | PRN
  Administered 2019-11-01: 20 mL

## 2019-11-01 MED ORDER — FENTANYL CITRATE (PF) 100 MCG/2ML IJ SOLN
INTRAMUSCULAR | Status: DC | PRN
  Administered 2019-11-01: 15 ug via INTRATHECAL

## 2019-11-01 MED ORDER — CEFAZOLIN SODIUM-DEXTROSE 2-4 GM/100ML-% IV SOLN
INTRAVENOUS | Status: AC
  Filled 2019-11-01: qty 100

## 2019-11-01 MED ORDER — DIPHENHYDRAMINE HCL 25 MG PO CAPS
25.0000 mg | ORAL_CAPSULE | Freq: Four times a day (QID) | ORAL | Status: DC | PRN
  Administered 2019-11-01 – 2019-11-02 (×2): 25 mg via ORAL
  Filled 2019-11-01 (×2): qty 1

## 2019-11-01 MED ORDER — BUPIVACAINE HCL (PF) 0.25 % IJ SOLN
INTRAMUSCULAR | Status: AC
  Filled 2019-11-01: qty 30

## 2019-11-01 MED ORDER — BUPIVACAINE HCL (PF) 0.25 % IJ SOLN
INTRAMUSCULAR | Status: DC | PRN
  Administered 2019-11-01: 20 mL

## 2019-11-01 MED ORDER — WITCH HAZEL-GLYCERIN EX PADS: 1.0000 "application " | MEDICATED_PAD | CUTANEOUS | Status: DC | PRN

## 2019-11-01 MED ORDER — ACETAMINOPHEN 160 MG/5ML PO SOLN: 1000.0000 mg | Freq: Once | ORAL | Status: DC

## 2019-11-01 MED ORDER — SIMETHICONE 80 MG PO CHEW
80.0000 mg | CHEWABLE_TABLET | ORAL | Status: DC
  Administered 2019-11-02 – 2019-11-03 (×2): 80 mg via ORAL
  Filled 2019-11-01 (×2): qty 1

## 2019-11-01 MED ORDER — MENTHOL 3 MG MT LOZG: 1.0000 | LOZENGE | OROMUCOSAL | Status: DC | PRN

## 2019-11-01 MED ORDER — BUPIVACAINE IN DEXTROSE 0.75-8.25 % IT SOLN
INTRATHECAL | Status: DC | PRN
  Administered 2019-11-01: 1.6 mL via INTRATHECAL

## 2019-11-01 MED ORDER — DEXMEDETOMIDINE (PRECEDEX) IN NS 20 MCG/5ML (4 MCG/ML) IV SYRINGE
PREFILLED_SYRINGE | INTRAVENOUS | Status: DC | PRN
  Administered 2019-11-01: 8 ug via INTRAVENOUS

## 2019-11-01 SURGICAL SUPPLY — 44 items
BENZOIN TINCTURE PRP APPL 2/3 (GAUZE/BANDAGES/DRESSINGS) ×3 IMPLANT
CLAMP CORD UMBIL (MISCELLANEOUS) IMPLANT
CLOSURE STERI STRIP 1/2 X4 (GAUZE/BANDAGES/DRESSINGS) ×3 IMPLANT
CLOTH BEACON ORANGE TIMEOUT ST (SAFETY) ×3 IMPLANT
DRAIN JACKSON PRT FLT 10 (DRAIN) IMPLANT
DRAPE C SECTION CLR SCREEN (DRAPES) ×3 IMPLANT
DRSG OPSITE POSTOP 4X10 (GAUZE/BANDAGES/DRESSINGS) ×3 IMPLANT
ELECT REM PT RETURN 9FT ADLT (ELECTROSURGICAL) ×3
ELECTRODE REM PT RTRN 9FT ADLT (ELECTROSURGICAL) ×1 IMPLANT
EVACUATOR SILICONE 100CC (DRAIN) IMPLANT
EXTRACTOR VACUUM M CUP 4 TUBE (SUCTIONS) IMPLANT
EXTRACTOR VACUUM M CUP 4' TUBE (SUCTIONS)
GAUZE SPONGE 4X4 12PLY STRL LF (GAUZE/BANDAGES/DRESSINGS) ×6 IMPLANT
GLOVE BIO SURGEON STRL SZ 6.5 (GLOVE) ×2 IMPLANT
GLOVE BIO SURGEONS STRL SZ 6.5 (GLOVE) ×1
GLOVE BIOGEL PI IND STRL 7.0 (GLOVE) ×2 IMPLANT
GLOVE BIOGEL PI INDICATOR 7.0 (GLOVE) ×4
GOWN STRL REUS W/TWL LRG LVL3 (GOWN DISPOSABLE) ×6 IMPLANT
KIT ABG SYR 3ML LUER SLIP (SYRINGE) IMPLANT
NEEDLE HYPO 25X5/8 SAFETYGLIDE (NEEDLE) IMPLANT
NS IRRIG 1000ML POUR BTL (IV SOLUTION) ×3 IMPLANT
PACK C SECTION WH (CUSTOM PROCEDURE TRAY) ×3 IMPLANT
PAD ABD 7.5X8 STRL (GAUZE/BANDAGES/DRESSINGS) ×3 IMPLANT
PAD OB MATERNITY 4.3X12.25 (PERSONAL CARE ITEMS) ×3 IMPLANT
PENCIL SMOKE EVAC W/HOLSTER (ELECTROSURGICAL) ×3 IMPLANT
RTRCTR C-SECT PINK 25CM LRG (MISCELLANEOUS) IMPLANT
RTRCTR WOUND ALEXIS 18CM SML (INSTRUMENTS) ×3
SAVER CELL AAL HAEMONETICS (INSTRUMENTS) ×1 IMPLANT
STRIP CLOSURE SKIN 1/2X4 (GAUZE/BANDAGES/DRESSINGS) ×2 IMPLANT
SUT CHROMIC 0 CT 1 (SUTURE) ×3 IMPLANT
SUT MNCRL AB 3-0 PS2 27 (SUTURE) ×3 IMPLANT
SUT PLAIN 2 0 (SUTURE) ×8
SUT PLAIN 2 0 XLH (SUTURE) ×3 IMPLANT
SUT PLAIN ABS 2-0 CT1 27XMFL (SUTURE) ×4 IMPLANT
SUT SILK 2 0 SH (SUTURE) IMPLANT
SUT VIC AB 0 CTX 36 (SUTURE) ×8
SUT VIC AB 0 CTX36XBRD ANBCTRL (SUTURE) ×4 IMPLANT
SUT VIC AB 2-0 SH 27 (SUTURE)
SUT VIC AB 2-0 SH 27XBRD (SUTURE) IMPLANT
SUT VIC AB 3-0 SH 27 (SUTURE) ×4
SUT VIC AB 3-0 SH 27X BRD (SUTURE) ×2 IMPLANT
TOWEL OR 17X24 6PK STRL BLUE (TOWEL DISPOSABLE) ×3 IMPLANT
TRAY FOLEY W/BAG SLVR 14FR LF (SET/KITS/TRAYS/PACK) ×3 IMPLANT
WATER STERILE IRR 1000ML POUR (IV SOLUTION) ×3 IMPLANT

## 2019-11-01 NOTE — Lactation Note (Addendum)
This note was copied from a baby's chart. Lactation Consultation Note  Patient Name: Brianna Mahoney [id number] Date: 11/01/2019 Reason for consult: Mother's request P4, 7 hour term female. Mom requesting assistance with latching infant on her right breast. Per mom, she only been latching infant on her left breast and she is left handed. LC entered room, mom attempting to latch infant on her right breast using the football hold position. LC assisted mom in repositioning  infant and giving her support pillows to support infant and her arm. Infant latched for few approximately 5 minutes on the right breast using the foot ball hold position,infant  became sleepy, only  holding breast in mouth, mom was doing breast stimulation with  Compressions but infant was no onger interested in feeding  at this time. Mom was doing STS with infant as LC left the room. Mom will continue to BF infant according to feeding cues and ask for latch assistance if needed.   Maternal Data Formula Feeding for Exclusion: No Has patient been taught Hand Expression?: Yes Does the patient have breastfeeding experience prior to this delivery?: Yes  Feeding Feeding Type: Breast Fed  LATCH Score Latch: Too sleepy or reluctant, no latch achieved, no sucking elicited.  Audible Swallowing: A few with stimulation  Type of Nipple: Everted at rest and after stimulation  Comfort (Breast/Nipple): Soft / non-tender  Hold (Positioning): Assistance needed to correctly position infant at breast and maintain latch.  LATCH Score: 6  Interventions Interventions: Assisted with latch;Skin to skin;Breast compression;Adjust position;Support pillows;Position options  Lactation Tools Discussed/Used Tools: Pump Breast pump type: Manual WIC Program: Yes Pump Review: Setup, frequency, and cleaning;Milk Storage Initiated by:: Danelle Earthly, IBCLC Date initiated:: 11/01/19   Consult Status Consult Status: Follow-up Date:  11/02/19 Follow-up type: In-patient    Danelle Earthly 11/01/2019, 9:21 PM

## 2019-11-01 NOTE — Op Note (Signed)
Cesarean Section Procedure Note   Brianna Mahoney  11/01/2019  Indications: Scheduled Proceedure/Maternal Request   Pre-operative Diagnosis: Prior Cesarean Section.   Post-operative Diagnosis: same and desires sterilization   Surgeon Sherilynn Dieu  Assistants: Dr Su Hilt  procecdure Repeat CS with B salpingectomy  Anesthesia: spinal   Procedure Details:  The patient was seen in the Holding Room. The risks, benefits, complications, treatment options, and expected outcomes were discussed with the patient. The patient concurred with the proposed plan, giving informed consent. identified as Brianna Mahoney and the procedure verified as C-Section Delivery. A Time Out was held and the above information confirmed.  After induction of anesthesia, the patient was draped and prepped in the usual sterile manner. A transverse incision was made and carried down through the subcutaneous tissue to the fascia. Fascial incision was made and extended transversely. The fascia was separated from the underlying rectus tissue superiorly and inferiorly. The peritoneum was identified and entered. Peritoneal incision was extended longitudinally. The utero-vesical peritoneal reflection was incised transversely and the bladder flap was bluntly freed from the lower uterine segment. A low transverse uterine incision was made. Delivered from cephalic presentation was a  pound Living newborn infant(s) or Female with Apgar scores of 8 at one minute and 9 at five minutes. Cord ph was not sent the umbilical cord was clamped and cut cord blood was obtained for evaluation. The placenta was removed Intact and appeared normal. The uterine outline, tubes and ovaries appeared normal}. The uterine incision was closed with running locked sutures of 0Vicryl. A second layer of 0 vicryl was used to imbricate the uterus.  The patients left fallopian tube was grasped with a babcock and clamped with haney clamp.  The tube was then removed with  metzenbaum scissors and ligated with 3-0 vicryl.   The patients right fallopian tube was removed in a similar fashion.    Both  tubes were sent to pathology.     Hemostasis was observed. Lavage was carried out until clear. The fascia was then reapproximated with running sutures of 0Vicryl. The subcuticular closure was performed using 3-81fast absorbing plain gut. The skin was closed with 4-31monocryl.  The pt had burning around incision.  20 cc marcaine was placed along the incision.    Instrument, sponge, and needle counts were correct prior the abdominal closure and were correct at the conclusion of the case.    Findings: Female infant in vertex presentation with clear fluid.     Estimated Blood Loss 217cc  Total IV Fluids:   Urine Output: 100CC OF clear urine  Specimens: @ORSPECIMEN @   Complications: no complications  Disposition: PACU - hemodynamically stable.   Maternal Condition: stable   Baby condition / location:  Couplet care / Skin to Skin  Attending Attestation: I was present and scrubbed for the entire procedure.   Signed: Surgeon(s): , MD Jaymes Graff, MD

## 2019-11-01 NOTE — H&P (Signed)
Brianna Mahoney is a 29 y.o. female presenting for Repeat CS with BTL. OB History    Gravida  4   Para  3   Term  3   Preterm      AB      Living  3     SAB      TAB      Ectopic      Multiple      Live Births             Past Medical History:  Diagnosis Date  . Gestational diabetes    Past Surgical History:  Procedure Laterality Date  . CESAREAN SECTION     Family History: family history includes Diabetes in her maternal grandmother; Multiple sclerosis in her father. Social History:  reports that she has never smoked. She has never used smokeless tobacco. She reports previous alcohol use. She reports that she does not use drugs.     Maternal Diabetes: Yes:  Diabetes Type:  Diet controlled Genetic Screening: Normal Maternal Ultrasounds/Referrals: Normal Fetal Ultrasounds or other Referrals:  None Maternal Substance Abuse:  No Significant Maternal Medications:  None Significant Maternal Lab Results:  Other:  Other Comments:  sickle cell trait  Review of Systems History   Blood pressure (P) 126/75, pulse (P) 92, temperature (P) 98.4 F (36.9 C), temperature source (P) Oral, resp. rate (P) 18, height (P) 5\' 5"  (1.651 m), weight (P) 81.2 kg, last menstrual period 02/07/2019, SpO2 (P) 98 %. Exam Physical Exam  Physical Examination: General appearance - alert, well appearing, and in no distress Chest - clear to auscultation, no wheezes, rales or rhonchi, symmetric air entry Heart - normal rate and regular rhythm Abdomen - soft, nontender, nondistended, no masses or organomegaly gravid Extremities - Homan's sign negative bilaterally Prenatal labs: ABO, Rh: --/--/O POS (08/24 0840) Antibody: NEG (08/24 0840) Rubella:   RPR: NON REACTIVE (08/24 0840)  HBsAg:    HIV:    GBS:     Assessment/Plan: Term for repeat CS with BTL R&B reviewed and failure rates Pt anemic will monitor blood loss Date of Initial H&P: today  History reviewed, patient  examined, no change in status, stable for surgery.   Krithi Bray A Krystol Rocco 11/01/2019, 1:00 PM

## 2019-11-01 NOTE — Anesthesia Postprocedure Evaluation (Signed)
Anesthesia Post Note  Patient: Brianna Mahoney  Procedure(s) Performed: CESAREAN SECTION WITH BILATERAL TUBAL LIGATION (Bilateral )     Patient location during evaluation: PACU Anesthesia Type: Combined Spinal/Epidural Level of consciousness: awake and alert and oriented Pain management: pain level controlled Vital Signs Assessment: post-procedure vital signs reviewed and stable Respiratory status: spontaneous breathing, nonlabored ventilation and respiratory function stable Cardiovascular status: blood pressure returned to baseline Postop Assessment: no apparent nausea or vomiting, spinal receding, no headache and no backache Anesthetic complications: no   No complications documented.  Last Vitals:  Vitals:   11/01/19 1445 11/01/19 1500  BP: 110/70 112/86  Pulse: 80 76  Resp: 16 14  Temp:    SpO2: 97% 99%    Last Pain:  Vitals:   11/01/19 1500  TempSrc:   PainSc: 4    Pain Goal:    LLE Motor Response: Purposeful movement (11/01/19 1500) LLE Sensation: Tingling (11/01/19 1500) RLE Motor Response: Purposeful movement (11/01/19 1500) RLE Sensation: Tingling (11/01/19 1500)     Epidural/Spinal Function Cutaneous sensation: Able to Wiggle Toes (11/01/19 1500), Patient able to flex knees: No (11/01/19 1500), Patient able to lift hips off bed: No (11/01/19 1500), Back pain beyond tenderness at insertion site: No (11/01/19 1500), Progressively worsening motor and/or sensory loss: No (11/01/19 1500), Bowel and/or bladder incontinence post epidural: No (11/01/19 1500)  Kaylyn Layer

## 2019-11-01 NOTE — Lactation Note (Signed)
This note was copied from a baby's chart. Lactation Consultation Note  Patient Name: Brianna Mahoney [id number] Date: 11/01/2019 Reason for consult: Initial assessment;Term P4, 7 hour term female infant/ Mom with C/S delivery hex of GDM and C/S trait. Per mom, she is active on the [facility] in [place], she attended BF classes in her pregnancy and she has an assigned BF International aid/development worker. Mom doesn't have breast pump at home. LC gave mom hand pump and demonstrated how to use, mom fitted with 27 mm breast flange.  LC did not observe latch at this time, per mom, infant BF for 10 minutes at 1830 pm and she felt infant latched well, she only felt a tug, this was mom's 3rd time latching infant at the breast. Per mom, infant latched in L&D for 6 minutes and 2nd time for 6 minutes. Mom feels infant is latching better than her 3rd child who she only BF for one month due to poor latching. LC discussed hand expression and taught back easily expression 3 mls of colostrum in a bullet she will latch infant at next feeding and offer EBM by finger feeding to infant or on a spoon. LC notice mom has large breast, LC discussed with mom using the football hold position due to her large breast and C/S delivery. Mom knows to call RN or LC if she has further questions, concerns or need assistance with latching infant at the breast. Mom knows to BF infant on demand, on cues, 8 to 12+ times within 24 hours. Mom will continue to do STS while breastfeeding. LC discussed with mom ways to keep infant awake while breastfeeding, BF infant STS, slightly rubbing his neck and shoulder, talking to infant, doing breast compression and burping infant and replacing him at breast to increase feeding duration . Mom made aware of O/P services, breastfeeding support groups, community resources, and our phone # for post-discharge questions.  Maternal Data Formula Feeding for Exclusion: No Has patient been taught Hand Expression?:  Yes Does the patient have breastfeeding experience prior to this delivery?: Yes  Feeding Feeding Type: Breast Fed  LATCH Score                   Interventions Interventions: Breast feeding basics reviewed;Skin to skin;Breast compression;Hand express;Position options;Expressed milk;Hand pump  Lactation Tools Discussed/Used WIC Program: Yes Pump Review: Setup, frequency, and cleaning;Milk Storage Initiated by:: Danelle Earthly, IBCLC Date initiated:: 11/01/19   Consult Status Consult Status: Follow-up Date: 11/02/19 Follow-up type: In-patient    Danelle Earthly 11/01/2019, 8:42 PM

## 2019-11-01 NOTE — Transfer of Care (Signed)
Immediate Anesthesia Transfer of Care Note  Patient: Brianna Mahoney  Procedure(s) Performed: CESAREAN SECTION WITH BILATERAL TUBAL LIGATION (Bilateral )  Patient Location: PACU  Anesthesia Type:Spinal  Level of Consciousness: awake, alert  and oriented  Airway & Oxygen Therapy: Patient Spontanous Breathing  Post-op Assessment: Report given to RN and Post -op Vital signs reviewed and stable  Post vital signs: Reviewed and stable  Last Vitals:  Vitals Value Taken Time  BP 106/70 11/01/19 1436  Temp    Pulse 80 11/01/19 1440  Resp 15 11/01/19 1440  SpO2 97 % 11/01/19 1440  Vitals shown include unvalidated device data.  Last Pain:  Vitals:   11/01/19 1434  TempSrc: (P) Oral         Complications: No complications documented.

## 2019-11-01 NOTE — Anesthesia Procedure Notes (Signed)
Epidural Patient location during procedure: OR Start time: 11/01/2019 1:02 PM End time: 11/01/2019 1:07 PM  Staffing Anesthesiologist: Kaylyn Layer, MD Performed: anesthesiologist   Preanesthetic Checklist Completed: patient identified, IV checked, risks and benefits discussed, monitors and equipment checked, pre-op evaluation and timeout performed  Epidural Patient position: sitting Prep: DuraPrep and site prepped and draped Patient monitoring: heart rate, continuous pulse ox and blood pressure Approach: midline Location: L3-L4 Injection technique: LOR air  Needle:  Needle type: Tuohy  Needle gauge: 17 G Needle length: 9 cm Needle insertion depth: 8 cm Catheter type: closed end flexible Catheter size: 19 Gauge Catheter at skin depth: 13 cm  Assessment Sensory level: T4 Events: blood not aspirated, injection not painful, no injection resistance, no paresthesia and negative IV test  Additional Notes Patient identified. Risks, benefits, and alternatives discussed with patient including but not limited to bleeding, infection, nerve damage, paralysis, failed block, incomplete pain control, headache, blood pressure changes, nausea, vomiting, reactions to medication, itching, and postpartum back pain. Confirmed most recent platelet count. Confirmed with patient that they are not currently taking any anticoagulation, have any bleeding history, or any family history of bleeding disorders. Patient expressed understanding and wished to proceed. All questions were answered. Sterile technique was used throughout the entire procedure. Please see notes for vital signs.   2 attempts required, unable to access interspinous space at L3-4. Crisp LOR at L2-3. [place] 25g spinal needle introduced through Tuohy with clear CSF return prior to injection of intrathecal medication. Spinal needle withdrawn and epidural catheter threaded easily. Negative aspiration of catheter for heme or  CSF.Reason for block:surgical anesthesia

## 2019-11-02 DIAGNOSIS — D509 Iron deficiency anemia, unspecified: Secondary | ICD-10-CM | POA: Diagnosis present

## 2019-11-02 LAB — CBC
HCT: 28.5 % — ABNORMAL LOW (ref 36.0–46.0)
Hemoglobin: 9 g/dL — ABNORMAL LOW (ref 12.0–15.0)
MCH: 25.7 pg — ABNORMAL LOW (ref 26.0–34.0)
MCHC: 31.6 g/dL (ref 30.0–36.0)
MCV: 81.4 fL (ref 80.0–100.0)
Platelets: 225 10*3/uL (ref 150–400)
RBC: 3.5 MIL/uL — ABNORMAL LOW (ref 3.87–5.11)
RDW: 14.1 % (ref 11.5–15.5)
WBC: 12.2 10*3/uL — ABNORMAL HIGH (ref 4.0–10.5)
nRBC: 0 % (ref 0.0–0.2)

## 2019-11-02 MED ORDER — MAGNESIUM OXIDE 400 (241.3 MG) MG PO TABS
400.0000 mg | ORAL_TABLET | Freq: Every day | ORAL | Status: DC
  Administered 2019-11-02 – 2019-11-03 (×2): 400 mg via ORAL
  Filled 2019-11-02 (×2): qty 1

## 2019-11-02 MED ORDER — POLYSACCHARIDE IRON COMPLEX 150 MG PO CAPS
150.0000 mg | ORAL_CAPSULE | Freq: Every day | ORAL | Status: DC
  Administered 2019-11-02 – 2019-11-03 (×2): 150 mg via ORAL
  Filled 2019-11-02 (×2): qty 1

## 2019-11-02 NOTE — Lactation Note (Signed)
This note was copied from a baby's chart. Lactation Consultation Note  Patient Name: Brianna Mahoney [id number] Date: 11/02/2019  Infant is 29 hours old 39 weeks with 2% weight loss. LC talked with Mom but she states she did not want to see Lactation at this time.      Najwa Spillane  Nicholson-Springer 11/02/2019, 6:59 PM

## 2019-11-02 NOTE — Progress Notes (Signed)
Subjective: POD# 1 Live born female  Birth Weight: 7 lb 13.2 oz (3550 g) APGAR: 8, 9  Newborn Delivery   Birth date/time: 11/01/2019 13:38:00 Delivery type: C-Section, Low Transverse Trial of labor: No C-section categorization: Repeat     Baby name: Brianna Mahoney  Delivering provider: Jaymes Graff   circumcision planned Feeding: breast  Pain control at delivery: Spinal   Reports feeling well.  Patient reports tolerating PO.   Breast symptoms:lsyvhing difficulty, large, pendulous breasts Pain controlled with PO meds Denies HA/SOB/C/P/N/V/dizziness. Flatus presnts. She reports vaginal bleeding as normal, without clots.  She is ambulating, urinating without difficulty.     Objective:   VS:    Vitals:   11/01/19 1815 11/01/19 1947 11/02/19 0046 11/02/19 0522  BP: 116/69 107/76  102/65  Pulse: 72 86  (!) 57  Resp: 20 18 18 18   Temp: 97.9 F (36.6 C) 98 F (36.7 C) 98 F (36.7 C) 97.9 F (36.6 C)  TempSrc: Axillary Axillary Axillary Axillary  SpO2: 99% 100%  100%  Weight:      Height:          Intake/Output Summary (Last 24 hours) at 11/02/2019 0826 Last data filed at 11/02/2019 0700 Gross per 24 hour  Intake 3652.63 ml  Output 3117 ml  Net 535.63 ml        Recent Labs    10/30/19 0840 11/02/19 0528  WBC 6.5 12.2*  HGB 9.5* 9.0*  HCT 29.9* 28.5*  PLT 212 225     Blood type: --/--/O POS (08/24 0840)  Rubella:   immune   Physical Exam:  General: alert, cooperative and no distress CV: Regular rate and rhythm Resp: clear Abdomen: soft, nontender, normal bowel sounds, mild tympany Incision: pressure dressing resmoved, honeycomb dressing saturated/removed, no oozing noted, skin well approximated. Sterile HC dressing applied. Uterine Fundus: firm, below umbilicus, nontender Lochia: minimal Ext: no edema, redness or tenderness in the calves or thighs      Assessment/Plan: 29 y.o.   POD# 1. 26                  Principal Problem:   Postpartum care  following cesarean delivery with BTL 8/26 Active Problems:   Encounter for maternal care for low transverse scar from previous cesarean delivery   S/P cesarean section   Iron deficiency anemia  - started oral Fe and Mag Ox  Doing well, stable.               Advance diet as tolerated Encourage rest when baby rests Breastfeeding support Encourage to ambulate Routine post-op care  9/26, CNM, MSN 11/02/2019, 8:26 AM

## 2019-11-03 MED ORDER — ACETAMINOPHEN 325 MG PO TABS: 650.0000 mg | ORAL_TABLET | ORAL | 1 refills | Status: AC | PRN

## 2019-11-03 MED ORDER — COCONUT OIL OIL: 1.0000 "application " | TOPICAL_OIL | 0 refills | Status: AC | PRN

## 2019-11-03 MED ORDER — MAGNESIUM OXIDE 400 (241.3 MG) MG PO TABS
400.0000 mg | ORAL_TABLET | Freq: Every day | ORAL | 1 refills | Status: DC
Start: 2019-11-04 — End: 2022-01-19

## 2019-11-03 MED ORDER — POLYSACCHARIDE IRON COMPLEX 150 MG PO CAPS: 150.0000 mg | ORAL_CAPSULE | Freq: Every day | ORAL | 1 refills | Status: AC

## 2019-11-03 MED ORDER — OXYCODONE-ACETAMINOPHEN 5-325 MG PO TABS
1.0000 | ORAL_TABLET | ORAL | 0 refills | Status: DC | PRN
Start: 2019-11-03 — End: 2019-11-04

## 2019-11-03 MED ORDER — IBUPROFEN 600 MG PO TABS: 600.0000 mg | ORAL_TABLET | Freq: Four times a day (QID) | ORAL | 0 refills | Status: DC | PRN

## 2019-11-03 NOTE — Lactation Note (Signed)
This note was copied from a baby's chart. Lactation Consultation Note  Patient Name: Brianna Mahoney [id number] Date: 11/03/2019 Reason for consult: Follow-up assessment;Infant weight loss  Baby is 66 hours old  As LC entered the room baby asleep in the crib.  Per mom last fed at 1000 for 30 mins and feels her milk is   Coming in.  Mom showed LC where her areolas have some edema, but compressible.  LC recommended prior to latch - breast massage, hand express, pre - pump if needed when her milk comes in and then reverse pressure. No breast down of either nipple. LC encourage mom to use her EBM to her nipples liberally.  Sore nipple and engorgement prevention and tx reviewed.  Per mom has a DEBP - Medela at home , and the DEBP from the hospital with the double set up and hand pump. Mom has the #24 F and #27 F .  LC discussed the importance of STS feedings until the baby is back to birth weight,gaining steadily and can stay awake for the majority of the feeding.  Mom has the [facility] pamphlet with phone numbers.    Maternal Data    Feeding Feeding Type:  (per mom baby last fed at 1000 for 30 mins)  LATCH Score                   Interventions Interventions: Breast feeding basics reviewed;Hand pump;DEBP  Lactation Tools Discussed/Used Tools: Pump;Flanges Flange Size: 24;27 Breast pump type: Double-Electric Breast Pump;Manual Pump Review: Milk Storage   Consult Status Consult Status: Complete Date: 11/03/19    Kathrin Greathouse 11/03/2019, 12:59 PM

## 2019-11-03 NOTE — Discharge Summary (Signed)
OB Discharge Summary  Patient Name: Brianna Mahoney DOB: [birth year] MRN: [id number]  Date of admission: 11/01/2019 Delivering provider: Jaymes Graff   Admitting diagnosis: Encounter for maternal care for low transverse scar from previous cesarean delivery [O34.211] S/P cesarean section [Z98.891] Intrauterine pregnancy: [redacted]w[redacted]d     Secondary diagnosis: Patient Active Problem List   Diagnosis Date Noted  . Postpartum care following cesarean delivery with BTL 8/26 11/02/2019  . Iron deficiency anemia 11/02/2019  . Encounter for maternal care for low transverse scar from previous cesarean delivery 11/01/2019  . S/P cesarean section 11/01/2019  . Gestational diabetes mellitus (GDM), antepartum 10/12/2019   Additional problems:None   Date of discharge: 11/03/2019   Discharge diagnosis: Principal Problem:   Postpartum care following cesarean delivery with BTL 8/26 Active Problems:   Encounter for maternal care for low transverse scar from previous cesarean delivery   S/P cesarean section   Iron deficiency anemia                                                         Post partum procedures:postpartum tubal ligation  Augmentation: N/A Pain control: Spinal  Laceration:None  Episiotomy:None  Complications: None  Hospital course:  Scheduled C/S   29 y.o. yo [id number] at [redacted]w[redacted]d was admitted to the hospital 11/01/2019 for scheduled cesarean section with the following indication:Elective Repeat.Delivery details are as follows:  Membrane Rupture Time/Date: 1:38 PM ,11/01/2019   Delivery Method:C-Section, Low Transverse  Details of operation can be found in separate operative note.  Patient had an uncomplicated postpartum course.  She is ambulating, tolerating a regular diet, passing flatus, and urinating well. Patient is discharged home in stable condition on  11/03/19        Newborn Data: Birth date:11/01/2019  Birth time:1:38 PM  Gender:Female  Living status:Living  Apgars:8 ,9  Weight:3550  g     Physical exam  Vitals:   11/02/19 0046 11/02/19 0522 11/03/19 0000 11/03/19 0555  BP:  102/65 108/62 111/64  Pulse:  (!) 57 70 73  Resp: 18 18 17 16   Temp: 98 F (36.7 C) 97.9 F (36.6 C) 98.3 F (36.8 C) 97.9 F (36.6 C)  TempSrc: Axillary Axillary Oral Oral  SpO2:  100% 100% 99%  Weight:      Height:       General: alert, cooperative and no distress Lochia: appropriate Uterine Fundus: firm Incision: Healing well with no significant drainage DVT Evaluation: No evidence of DVT seen on physical exam. Labs: Lab Results  Component Value Date   WBC 12.2 (H) 11/02/2019   HGB 9.0 (L) 11/02/2019   HCT 28.5 (L) 11/02/2019   MCV 81.4 11/02/2019   PLT 225 11/02/2019   No flowsheet data found. Edinburgh Postnatal Depression Scale Screening Tool 11/02/2019  I have been able to laugh and see the funny side of things. 0  I have looked forward with enjoyment to things. 0  I have blamed myself unnecessarily when things went wrong. 0  I have been anxious or worried for no good reason. 0  I have felt scared or panicky for no good reason. 0  Things have been getting on top of me. 0  I have been so unhappy that I have had difficulty sleeping. 0  I have felt sad or miserable. 0  I have been so  unhappy that I have been crying. 0  The thought of harming myself has occurred to me. 0  Edinburgh Postnatal Depression Scale Total 0   Discharge instruction:  per After Visit Summary  After Visit Meds:  Allergies as of 11/03/2019   No Known Allergies     Medication List    TAKE these medications   acetaminophen 325 MG tablet Commonly known as: TYLENOL Take 2 tablets (650 mg total) by mouth every 4 (four) hours as needed for mild pain (temperature > 101.5.).   coconut oil Oil Apply 1 application topically as needed.   ibuprofen 600 MG tablet Commonly known as: ADVIL Take 1 tablet (600 mg total) by mouth every 6 (six) hours as needed for cramping.   iron polysaccharides 150 MG  capsule Commonly known as: NIFEREX Take 1 capsule (150 mg total) by mouth daily. Start taking on: November 04, 2019   magnesium oxide 400 (241.3 Mg) MG tablet Commonly known as: MAG-OX Take 1 tablet (400 mg total) by mouth daily. Start taking on: November 04, 2019   oxyCODONE-acetaminophen 5-325 MG tablet Commonly known as: PERCOCET/ROXICET Take 1-2 tablets by mouth every 4 (four) hours as needed for moderate pain.      Diet: routine diet  Activity: Advance as tolerated. Pelvic rest for 6 weeks.   Postpartum contraception: Tubal Ligation  Newborn Data: Live born female  Birth Weight: 7 lb 13.2 oz (3550 g) APGAR: 8, 9  Newborn Delivery   Birth date/time: 11/01/2019 13:38:00 Delivery type: C-Section, Low Transverse Trial of labor: No C-section categorization: Repeat     Named [place] Baby Feeding: Breast Disposition:home with mother  Delivery Report:  Review the Delivery Report for details.    Follow up:  Follow-up Information    [facility]. Schedule an appointment as soon as possible for a visit in 2 week(s).   Specialty: Obstetrics and Gynecology Why: Please make an appointment for 2 weeks psotpartum.  Contact information: [address] [place] [address] [phone]             Signed: Clancy Gourd, MSN 11/03/2019, 12:36 PM

## 2019-11-04 ENCOUNTER — Other Ambulatory Visit: Payer: Self-pay | Admitting: Family Medicine

## 2019-11-04 MED ORDER — OXYCODONE-ACETAMINOPHEN 5-325 MG PO TABS
1.0000 | ORAL_TABLET | ORAL | 0 refills | Status: AC | PRN
Start: 2019-11-04 — End: 2020-11-03

## 2019-11-04 MED ORDER — OXYCODONE-ACETAMINOPHEN 5-325 MG PO TABS: 1.0000 | ORAL_TABLET | ORAL | 0 refills | Status: DC | PRN

## 2019-11-04 MED ORDER — OXYCODONE-ACETAMINOPHEN 5-325 MG PO TABS
1.0000 | ORAL_TABLET | ORAL | 0 refills | Status: DC | PRN
Start: 2019-11-04 — End: 2019-11-04

## 2019-11-04 NOTE — Addendum Note (Signed)
Addended by: Lynnell Jude on: 11/04/2019 06:05 PM   Modules accepted: Orders

## 2019-11-04 NOTE — Addendum Note (Signed)
Addended by: Lynnell Jude on: 11/04/2019 06:15 PM   Modules accepted: Orders

## 2019-11-05 LAB — SURGICAL PATHOLOGY

## 2020-10-17 ENCOUNTER — Ambulatory Visit: Payer: Self-pay | Admitting: *Deleted

## 2020-10-17 NOTE — Telephone Encounter (Signed)
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 Patient reports she had x ray completed and results were "fine" . 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 Reviewed with paitent if she needs documentation she may have to go back to ED for a copy that x ray has no acute findings or TB. Called patient back and left messaged she can set up my chart to view results as well. Left number for activation code: [phone]. Care advise given. Patient verbalized understanding of care advise and to call back for further questions.    Reason for Disposition  General information question, no triage required and triager able to answer question  Answer Assessment - Initial Assessment Questions 1. 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  Protocols used: Information Only Call - No Triage-A-AH

## 2020-10-17 NOTE — Telephone Encounter (Signed)
Pt called reporting that she has had a reaction to her TB test and has questions, is not sure if she should go to the ED. Please advise  Best contact: [phone]   Called patient to review questions. No answer, LVMTCB [phone].

## 2021-08-17 ENCOUNTER — Emergency Department (HOSPITAL_COMMUNITY)
Admission: EM | Admit: 2021-08-17 | Discharge: 2021-08-17 | Disposition: A | Discharge status: Home / Self Care | Payer: Medicaid Other | Attending: Emergency Medicine | Admitting: Emergency Medicine

## 2021-08-17 ENCOUNTER — Encounter (HOSPITAL_COMMUNITY): Payer: Self-pay

## 2021-08-17 ENCOUNTER — Other Ambulatory Visit: Payer: Self-pay

## 2021-08-17 DIAGNOSIS — Z711 Person with feared health complaint in whom no diagnosis is made: Secondary | ICD-10-CM

## 2021-08-17 DIAGNOSIS — Z113 Encounter for screening for infections with a predominantly sexual mode of transmission: Secondary | ICD-10-CM | POA: Insufficient documentation

## 2021-08-17 LAB — POC URINE PREG, ED: Preg Test, Ur: NEGATIVE

## 2021-08-17 MED ORDER — STERILE WATER FOR INJECTION IJ SOLN
INTRAMUSCULAR | Status: AC
  Filled 2021-08-17: qty 10

## 2021-08-17 MED ORDER — CEFTRIAXONE SODIUM 1 G IJ SOLR
500.0000 mg | Freq: Once | INTRAMUSCULAR | Status: AC
  Administered 2021-08-17: 500 mg via INTRAMUSCULAR
  Filled 2021-08-17: qty 10

## 2021-08-17 NOTE — ED Provider Triage Note (Signed)
Emergency Medicine Provider Triage Evaluation Note  Brianna Mahoney , a 31 y.o. female  was evaluated in triage.  Pt complains of concern for STD.  Treated for gonorrhea last week.  Had sexual intercourse again, unprotected.  No tenderness, discharge however did not have discharge previously on prior diagnosis.  She feels like she feels similar to before  Review of Systems  Positive: Concern for STD Negative:   Physical Exam  BP 129/84 (BP Location: Left Arm)   Pulse 83   Temp 98.4 F (36.9 C) (Oral)   Resp 16   SpO2 100%  Gen:   Awake, no distress   Resp:  Normal effort  MSK:   Moves extremities without difficulty  Other:    Medical Decision Making  Medically screening exam initiated at 5:24 PM.  Appropriate orders placed.  Brianna Mahoney was informed that the remainder of the evaluation will be completed by another provider, this initial triage assessment does not replace that evaluation, and the importance of remaining in the ED until their evaluation is complete.  STD check   [name] A, PA-C 08/17/21 1727

## 2021-08-17 NOTE — ED Triage Notes (Signed)
Pt reports she was diagnosed with gonorrhea and treated last week. Pt states her symptoms have and gone away, but she felt a strange sensation in her stomach and wanted to be rechecked just in case.

## 2021-08-17 NOTE — ED Provider Notes (Signed)
[facility]-EMERGENCY DEPT Provider Note   CSN: [id number] Arrival date & time: 08/17/21  1632    History  Chief Complaint  Patient presents with   SEXUALLY TRANSMITTED DISEASE    Brianna Mahoney is a 31 y.o. female history of gonorrhea.  States was treated 10 days ago.  Not wait 1 week after being treated before she had unprotected sexual intercourse.  She is concerned she has recurrent gonorrhea.  Denies any pain, dysuria, hematuria or discharge.  States she would like additional dose of Rocephin here until her cultures return.  HPI     Home Medications Prior to Admission medications   Medication Sig Start Date End Date Taking? Authorizing Provider  acetaminophen (TYLENOL) 325 MG tablet Take 2 tablets (650 mg total) by mouth every 4 (four) hours as needed for mild pain (temperature > 101.5.). 11/03/19   June Leap, CNM  coconut oil OIL Apply 1 application topically as needed. 11/03/19   June Leap, CNM  ibuprofen (ADVIL) 600 MG tablet Take 1 tablet (600 mg total) by mouth every 6 (six) hours as needed for cramping. 11/03/19   June Leap, CNM  iron polysaccharides (NIFEREX) 150 MG capsule Take 1 capsule (150 mg total) by mouth daily. 11/04/19   June Leap, CNM  magnesium oxide (MAG-OX) 400 (241.3 Mg) MG tablet Take 1 tablet (400 mg total) by mouth daily. 11/04/19   June Leap, CNM      Allergies    Patient has no known allergies.    Review of Systems   Review of Systems  Constitutional: Negative.   HENT: Negative.    Respiratory: Negative.    Cardiovascular: Negative.   Gastrointestinal: Negative.   Genitourinary: Negative.   Musculoskeletal: Negative.   Skin: Negative.   Neurological: Negative.   All other systems reviewed and are negative.   Physical Exam Updated Vital Signs BP 129/84 (BP Location: Left Arm)   Pulse 83   Temp 98.4 F (36.9 C) (Oral)   Resp 16   SpO2 100%  Physical Exam Vitals and nursing note reviewed.   Constitutional:      General: She is not in acute distress.    Appearance: She is well-developed. She is not ill-appearing, toxic-appearing or diaphoretic.  HENT:     Head: Normocephalic and atraumatic.  Eyes:     Pupils: Pupils are equal, round, and reactive to light.  Cardiovascular:     Rate and Rhythm: Normal rate.     Pulses: Normal pulses.     Heart sounds: Normal heart sounds.  Pulmonary:     Effort: Pulmonary effort is normal. No respiratory distress.     Breath sounds: Normal breath sounds.  Abdominal:     General: Bowel sounds are normal. There is no distension.  Genitourinary:    Comments: Declined Musculoskeletal:        General: Normal range of motion.     Cervical back: Normal range of motion.  Skin:    General: Skin is warm and dry.     Capillary Refill: Capillary refill takes less than 2 seconds.  Neurological:     General: No focal deficit present.     Mental Status: She is alert.  Psychiatric:        Mood and Affect: Mood normal.     ED Results / Procedures / Treatments   Labs (all labs ordered are listed, but only abnormal results are displayed) Labs Reviewed  POC URINE PREG, ED  GC/CHLAMYDIA PROBE AMP ([place]) NOT AT [facility]    EKG None  Radiology No results found.  Procedures Procedures    Medications Ordered in ED Medications  cefTRIAXone (ROCEPHIN) injection 500 mg (has no administration in time range)  sterile water (preservative free) injection (has no administration in time range)    ED Course/ Medical Decision Making/ A&P    31 year old history of prior STD here for concern of recurrent gonorrhea.  Was treated approximately 10 days ago.  Did not wait duration of time or use protection after recurrent sexual intercourse.  She is concerned she still has gonorrhea.  She is currently asymptomatic.  Patient requesting treatment while pending cultures.  Declined GU exam.  Labs personally viewed and interpreted:  Preg neg GC  pending  Will treat with ABX. She stands to follow-up with results when they return in 2 days.  Encourage abstaining from intercourse until results return.  Pt presents with concerns for possible STD.  Pt understands that they have GC/Chlamydia cultures pending and that they will need to inform all sexual partners if results return positive. Pt not concerning for PID because hemodynamically stable and no pain or dc.  Patient to be discharged with instructions to follow up with OBGYN/PCP. Discussed importance of using protection when sexually active.    The patient has been appropriately medically screened and/or stabilized in the ED. I have low suspicion for any other emergent medical condition which would require further screening, evaluation or treatment in the ED or require inpatient management.  Patient is hemodynamically stable and in no acute distress.  Patient able to ambulate in department prior to ED.  Evaluation does not show acute pathology that would require ongoing or additional emergent interventions while in the emergency department or further inpatient treatment.  I have discussed the diagnosis with the patient and answered all questions.  Pain is been managed while in the emergency department and patient has no further complaints prior to discharge.  Patient is comfortable with plan discussed in room and is stable for discharge at this time.  I have discussed strict return precautions for returning to the emergency department.  Patient was encouraged to follow-up with PCP/specialist refer to at discharge.                           Medical Decision Making Amount and/or Complexity of Data Reviewed External Data Reviewed: labs and notes. Labs: ordered. Decision-making details documented in ED Course.  Risk OTC drugs. Prescription drug management. Parenteral controlled substances. Diagnosis or treatment significantly limited by social determinants of  health.           Final Clinical Impression(s) / ED Diagnoses Final diagnoses:  Concern about STD in female without diagnosis    Rx / DC Orders ED Discharge Orders     None         Gwenn Teodoro A, PA-C 08/17/21 2037    Mancel Bale, MD 08/19/21 [phone]

## 2021-08-17 NOTE — Discharge Instructions (Addendum)
Gonorrhea and Chlamydia test will take 2 days to return.  No sexual intercourse for at least 1 week  Return for any new or worsening symptoms

## 2021-08-18 LAB — GC/CHLAMYDIA PROBE AMP (~~LOC~~) NOT AT ARMC
Chlamydia: NEGATIVE
Comment: NEGATIVE
Comment: NORMAL
Neisseria Gonorrhea: NEGATIVE

## 2021-09-12 ENCOUNTER — Telehealth: Payer: Medicaid Other | Admitting: Physician Assistant

## 2021-09-12 DIAGNOSIS — M546 Pain in thoracic spine: Secondary | ICD-10-CM

## 2021-09-12 MED ORDER — CYCLOBENZAPRINE HCL 10 MG PO TABS: 10.0000 mg | ORAL_TABLET | Freq: Three times a day (TID) | ORAL | 0 refills | Status: DC | PRN

## 2021-09-12 MED ORDER — IBUPROFEN 600 MG PO TABS: 600.0000 mg | ORAL_TABLET | Freq: Four times a day (QID) | ORAL | 0 refills | Status: AC | PRN

## 2021-09-12 NOTE — Patient Instructions (Signed)
It was great to see you!  Ibuprofen 600 mg up to 3 times daily for your pain  May use flexeril 10 mg up to 3 times daily for muscle spasms  Follow-up if new/worsening symptoms with an in-person provider  Take care,  Jarold Motto PA-C

## 2021-09-12 NOTE — Progress Notes (Signed)
Virtual Visit Consent   Brianna Mahoney, you are scheduled for a virtual visit with a [facility] provider today. Just as with appointments in the office, your consent must be obtained to participate. Your consent will be active for this visit and any virtual visit you may have with one of our providers in the next 365 days. If you have a MyChart account, a copy of this consent can be sent to you electronically.  As this is a virtual visit, video technology does not allow for your provider to perform a traditional examination. This may limit your provider's ability to fully assess your condition. If your provider identifies any concerns that need to be evaluated in person or the need to arrange testing (such as labs, EKG, etc.), we will make arrangements to do so. Although advances in technology are sophisticated, we cannot ensure that it will always work on either your end or our end. If the connection with a video visit is poor, the visit may have to be switched to a telephone visit. With either a video or telephone visit, we are not always able to ensure that we have a secure connection.  By engaging in this virtual visit, you consent to the provision of healthcare and authorize for your insurance to be billed (if applicable) for the services provided during this visit. Depending on your insurance coverage, you may receive a charge related to this service.  I need to obtain your verbal consent now. Are you willing to proceed with your visit today? Brianna Mahoney has provided verbal consent on 09/12/2021 for a virtual visit (video or telephone). Jarold Motto, [place]  Date: 09/12/2021 10:39 AM  Virtual Visit via Video Note   I, Jarold Motto, connected with  Brianna Mahoney  ([id number], 1990-08-10) on 09/12/21 at 10:30 AM EDT by a video-enabled telemedicine application and verified that I am speaking with the correct person using two identifiers.  Location: Patient: Virtual Visit Location Patient:  Home Provider: Virtual Visit Location Provider: Home Office   I discussed the limitations of evaluation and management by telemedicine and the availability of in person appointments. The patient expressed understanding and agreed to proceed.    History of Present Illness: Brianna Mahoney is a 31 y.o. who identifies as a female who was assigned female at birth, and is being seen today for back pain.  Patient reports 1-2 day hx of mid-back pain. 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 Pain is on the R side in the middle of her back. She has tried ibuprofen 200 mg with mild relief.   Denies: pain with urination, blood in urine, hx of kidney stones, cough, chest pain, SOB, chest tightness, numbness/tingling, weakness, saddle anesthesia, incontinence.  Denies concerns for pregnancy.  Would like a work note.  HPI: HPI  Problems:  Patient Active Problem List   Diagnosis Date Noted   Iron deficiency anemia 11/02/2019   S/P cesarean section 11/01/2019   Gestational diabetes mellitus (GDM), antepartum 10/12/2019    Allergies: No Known Allergies Medications:  Current Outpatient Medications:    cyclobenzaprine (FLEXERIL) 10 MG tablet, Take 1 tablet (10 mg total) by mouth 3 (three) times daily as needed for muscle spasms., Disp: 20 tablet, Rfl: 0   acetaminophen (TYLENOL) 325 MG tablet, Take 2 tablets (650 mg total) by mouth every 4 (four) hours as needed for mild pain (temperature > 101.5.)., Disp: 30 tablet, Rfl: 1   coconut oil OIL, Apply  1 application topically as needed., Disp: , Rfl: 0   ibuprofen (ADVIL) 600 MG tablet, Take 1 tablet (600 mg total) by mouth every 6 (six) hours as needed for moderate pain., Disp: 30 tablet, Rfl: 0   iron polysaccharides (NIFEREX) 150 MG capsule, Take 1 capsule (150 mg total) by mouth daily., Disp: 30 capsule, Rfl: 1   magnesium oxide (MAG-OX) 400 (241.3 Mg) MG tablet, Take 1 tablet (400 mg total) by mouth daily., Disp: 30 tablet, Rfl:  1  Observations/Objective: Patient is well-developed, well-nourished in no acute distress.  Resting comfortably  at home.  Head is normocephalic, atraumatic.  No labored breathing.  Speech is clear and coherent with logical content.  Patient is alert and oriented at baseline.   Assessment and Plan: 1. Acute right-sided thoracic back pain No red flags Refilled 600 mg ibuprofen for this Also provided short script of flexeril 10 mg prn -- sleepy/drowsy precautions discussed Work note provided If lack of improvement or any worsening -- needs in-person evaluation  Follow Up Instructions: I discussed the assessment and treatment plan with the patient. The patient was provided an opportunity to ask questions and all were answered. The patient agreed with the plan and demonstrated an understanding of the instructions.  A copy of instructions were sent to the patient via MyChart unless otherwise noted below.   The patient was advised to call back or seek an in-person evaluation if the symptoms worsen or if the condition fails to improve as anticipated.  Time:  I spent 5-10 minutes with the patient via telehealth technology discussing the above problems/concerns.    Jarold Motto, [place]

## 2021-09-30 ENCOUNTER — Telehealth: Payer: Medicaid Other | Admitting: Physician Assistant

## 2021-09-30 DIAGNOSIS — M545 Low back pain, unspecified: Secondary | ICD-10-CM

## 2021-09-30 NOTE — Progress Notes (Signed)
Because you have been treated for this with first-line medications already this month and have failed treatment, I feel your condition warrants further evaluation and I recommend that you be seen in a face to face visit.   NOTE: There will be NO CHARGE for this eVisit   If you are having a true medical emergency please call 911.      For an urgent face to face visit, [facility] has seven urgent care centers for your convenience:     [facility] at [facility] Directions [phone] [address] [place], [place] [address]    [facility]) Get Driving Directions [phone] [address] [place], [place] [address]  [facility]) Get Driving Directions [phone] [address] [place],  [place]  [address]  [facility] at [facility] Directions  [phone] 437 187 [address] [place],  [place] [address]   [facility] Care at [facility] Get Driving Directions [phone] [address] [place], [place] [address]   [facility] at [facility] Get Driving Directions  [phone] [address] [place], [place] [address]   [facility] Urgent Care at [facility] Directions [phone] [address] [place], [place] [address]  [facility] [address], [place], [place] [address] URGENT CARE HOURS Monday - Friday: 8:00am to 8:00pm Saturday: 10:00am to 3:00pm [phone]   Your MyChart E-visit questionnaire answers were reviewed by a board certified advanced clinical practitioner to complete your personal care plan based on your specific symptoms.  Thank you for using e-Visits.   I provided 5 minutes of non face-to-face time during this encounter for chart review and documentation.

## 2021-10-19 ENCOUNTER — Other Ambulatory Visit: Payer: Self-pay

## 2021-10-19 ENCOUNTER — Encounter (HOSPITAL_COMMUNITY): Payer: Self-pay

## 2021-10-19 ENCOUNTER — Emergency Department (HOSPITAL_COMMUNITY)
Admission: EM | Admit: 2021-10-19 | Discharge: 2021-10-19 | Disposition: A | Discharge status: Home / Self Care | Payer: Medicaid Other | Attending: Emergency Medicine | Admitting: Emergency Medicine

## 2021-10-19 DIAGNOSIS — Z202 Contact with and (suspected) exposure to infections with a predominantly sexual mode of transmission: Secondary | ICD-10-CM | POA: Insufficient documentation

## 2021-10-19 DIAGNOSIS — B9689 Other specified bacterial agents as the cause of diseases classified elsewhere: Secondary | ICD-10-CM | POA: Insufficient documentation

## 2021-10-19 DIAGNOSIS — N760 Acute vaginitis: Secondary | ICD-10-CM | POA: Diagnosis not present

## 2021-10-19 DIAGNOSIS — N898 Other specified noninflammatory disorders of vagina: Secondary | ICD-10-CM | POA: Diagnosis present

## 2021-10-19 DIAGNOSIS — Z711 Person with feared health complaint in whom no diagnosis is made: Secondary | ICD-10-CM

## 2021-10-19 LAB — URINALYSIS, ROUTINE W REFLEX MICROSCOPIC
Bilirubin Urine: NEGATIVE
Glucose, UA: NEGATIVE mg/dL
Hgb urine dipstick: NEGATIVE
Ketones, ur: NEGATIVE mg/dL
Leukocytes,Ua: NEGATIVE
Nitrite: NEGATIVE
Protein, ur: NEGATIVE mg/dL
Specific Gravity, Urine: 1.013 (ref 1.005–1.030)
pH: 5 (ref 5.0–8.0)

## 2021-10-19 LAB — WET PREP, GENITAL
Sperm: NONE SEEN
Trich, Wet Prep: NONE SEEN
WBC, Wet Prep HPF POC: >=10 — AB (ref <10)
Yeast Wet Prep HPF POC: NONE SEEN

## 2021-10-19 LAB — HIV ANTIBODY (ROUTINE TESTING W REFLEX): HIV Screen 4th Generation wRfx: NONREACTIVE

## 2021-10-19 LAB — PREGNANCY, URINE: Preg Test, Ur: NEGATIVE

## 2021-10-19 MED ORDER — STERILE WATER FOR INJECTION IJ SOLN
INTRAMUSCULAR | Status: AC
  Filled 2021-10-19: qty 10

## 2021-10-19 MED ORDER — CEFTRIAXONE SODIUM 500 MG IJ SOLR
500.0000 mg | Freq: Once | INTRAMUSCULAR | Status: AC
  Administered 2021-10-19: 500 mg via INTRAMUSCULAR
  Filled 2021-10-19: qty 500

## 2021-10-19 MED ORDER — DOXYCYCLINE HYCLATE 100 MG PO CAPS: 100.0000 mg | ORAL_CAPSULE | Freq: Two times a day (BID) | ORAL | 0 refills | Status: DC

## 2021-10-19 MED ORDER — DOXYCYCLINE HYCLATE 100 MG PO TABS
100.0000 mg | ORAL_TABLET | Freq: Once | ORAL | Status: AC
  Administered 2021-10-19: 100 mg via ORAL
  Filled 2021-10-19: qty 1

## 2021-10-19 MED ORDER — METRONIDAZOLE 500 MG PO TABS: 500.0000 mg | ORAL_TABLET | Freq: Two times a day (BID) | ORAL | 0 refills | Status: DC

## 2021-10-19 NOTE — ED Provider Notes (Cosign Needed Addendum)
[facility] EMERGENCY DEPARTMENT Provider Note   CSN: [id number] Arrival date & time: 10/19/21  1321     History  Chief Complaint  Patient presents with   Exposure to STD    Brianna Mahoney is a 31 y.o. female here for evaluation of vaginal discharge.  She is requesting be checked for STDs.  Reports a clear vaginal discharge.  No known exposure.  States she does have history of gonorrhea and chlamydia and would like to be treated for this at this time.  Sexually active with female partners.  Does not use protection.  No fever, abdominal pain, pelvic pain, rashes, lesions, dysuria hematuria.  No meds PTA.  HPI     Home Medications Prior to Admission medications   Medication Sig Start Date End Date Taking? Authorizing Provider  metroNIDAZOLE (FLAGYL) 500 MG tablet Take 1 tablet (500 mg total) by mouth 2 (two) times daily. 10/19/21  Yes Abaigeal Moomaw A, PA-C  acetaminophen (TYLENOL) 325 MG tablet Take 2 tablets (650 mg total) by mouth every 4 (four) hours as needed for mild pain (temperature > 101.5.). 11/03/19   June Leap, CNM  coconut oil OIL Apply 1 application topically as needed. 11/03/19   June Leap, CNM  cyclobenzaprine (FLEXERIL) 10 MG tablet Take 1 tablet (10 mg total) by mouth 3 (three) times daily as needed for muscle spasms. 09/12/21   Jarold Motto, PA  doxycycline (VIBRAMYCIN) 100 MG capsule Take 1 capsule (100 mg total) by mouth 2 (two) times daily. 10/19/21   Ryanne Morand A, PA-C  ibuprofen (ADVIL) 600 MG tablet Take 1 tablet (600 mg total) by mouth every 6 (six) hours as needed for moderate pain. 09/12/21   Jarold Motto, PA  iron polysaccharides (NIFEREX) 150 MG capsule Take 1 capsule (150 mg total) by mouth daily. 11/04/19   June Leap, CNM  magnesium oxide (MAG-OX) 400 (241.3 Mg) MG tablet Take 1 tablet (400 mg total) by mouth daily. 11/04/19   June Leap, CNM      Allergies    Patient has no known allergies.    Review of  Systems   Review of Systems  Constitutional: Negative.   HENT: Negative.    Respiratory: Negative.    Cardiovascular: Negative.   Gastrointestinal: Negative.   Genitourinary:  Positive for vaginal discharge. Negative for decreased urine volume, difficulty urinating, dyspareunia, dysuria, enuresis, flank pain, frequency, hematuria, menstrual problem, pelvic pain, urgency, vaginal bleeding and vaginal pain.  Musculoskeletal: Negative.   Skin: Negative.   Neurological: Negative.   All other systems reviewed and are negative.   Physical Exam Updated Vital Signs BP 113/67 (BP Location: Right Arm)   Pulse 77   Temp 98.1 F (36.7 C) (Oral)   Resp 16   Ht 5\' 5"  (1.651 m)   Wt 77.1 kg   LMP 09/16/2021 (Approximate)   SpO2 100%   BMI 28.29 kg/m  Physical Exam Vitals and nursing note reviewed.  Constitutional:      General: She is not in acute distress.    Appearance: She is well-developed. She is not ill-appearing, toxic-appearing or diaphoretic.  HENT:     Head: Normocephalic and atraumatic.     Nose: Nose normal.     Mouth/Throat:     Mouth: Mucous membranes are moist.  Eyes:     Pupils: Pupils are equal, round, and reactive to light.  Cardiovascular:     Rate and Rhythm: Normal rate.     Pulses: Normal  pulses.     Heart sounds: Normal heart sounds.  Pulmonary:     Effort: Pulmonary effort is normal. No respiratory distress.     Breath sounds: Normal breath sounds.  Abdominal:     General: Bowel sounds are normal. There is no distension.     Palpations: Abdomen is soft.  Genitourinary:    Comments: Deferred GU exam Musculoskeletal:        General: Normal range of motion.     Cervical back: Normal range of motion.  Skin:    General: Skin is warm and dry.     Capillary Refill: Capillary refill takes less than 2 seconds.  Neurological:     General: No focal deficit present.     Mental Status: She is alert and oriented to person, place, and time.  Psychiatric:         Mood and Affect: Mood normal.     ED Results / Procedures / Treatments   Labs (all labs ordered are listed, but only abnormal results are displayed) Labs Reviewed  WET PREP, GENITAL - Abnormal; Notable for the following components:      Result Value   Clue Cells Wet Prep HPF POC PRESENT (*)    WBC, Wet Prep HPF POC >=10 (*)    All other components within normal limits  URINALYSIS, ROUTINE W REFLEX MICROSCOPIC - Abnormal; Notable for the following components:   APPearance HAZY (*)    All other components within normal limits  PREGNANCY, URINE  HIV ANTIBODY (ROUTINE TESTING W REFLEX)  RPR  GC/CHLAMYDIA PROBE AMP ([place]) NOT AT [facility]    EKG None  Radiology No results found.  Procedures Procedures    Medications Ordered in ED Medications  sterile water (preservative free) injection (has no administration in time range)  cefTRIAXone (ROCEPHIN) injection 500 mg (500 mg Intramuscular Given 10/19/21 2103)  doxycycline (VIBRA-TABS) tablet 100 mg (100 mg Oral Given 10/19/21 2103)    ED Course/ Medical Decision Making/ A&P     31 year old history of prior STD per patient here for evaluation of vaginal discharge.  Has noted clear vaginal discharge.  No pain, urinary symptoms.  She is sexually active with multiple partners and does not use protection.  Elects to self swab.  Labs personally viewed and interpreted:  Pregnancy is negative UA negative for infection HIV negative  Given Rocephin and Doxy.  States she would like to be discharged we will follow-up with wet prep results on MyChart.  Feels is reasonable.  Encourage close follow-up with PCP, health department for further STD screens  The patient has been appropriately medically screened and/or stabilized in the ED. I have low suspicion for any other emergent medical condition which would require further screening, evaluation or treatment in the ED or require inpatient management.  Patient is hemodynamically stable  and in no acute distress.  Patient able to ambulate in department prior to ED.  Evaluation does not show acute pathology that would require ongoing or additional emergent interventions while in the emergency department or further inpatient treatment.  I have discussed the diagnosis with the patient and answered all questions.  Pain is been managed while in the emergency department and patient has no further complaints prior to discharge.  Patient is comfortable with plan discussed in room and is stable for discharge at this time.  I have discussed strict return precautions for returning to the emergency department.  Patient was encouraged to follow-up with PCP/specialist refer to at discharge.  Medical Decision Making Amount and/or Complexity of Data Reviewed External Data Reviewed: labs, radiology and notes. Labs: ordered. Decision-making details documented in ED Course.  Risk OTC drugs. Prescription drug management. Parenteral controlled substances. Diagnosis or treatment significantly limited by social determinants of health.          Final Clinical Impression(s) / ED Diagnoses Final diagnoses:  Concern about STD in female without diagnosis  Bacterial vaginosis    Rx / DC Orders ED Discharge Orders          Ordered    doxycycline (VIBRAMYCIN) 100 MG capsule  2 times daily,   Status:  Discontinued        10/19/21 2109    doxycycline (VIBRAMYCIN) 100 MG capsule  2 times daily        10/19/21 2119    metroNIDAZOLE (FLAGYL) 500 MG tablet  2 times daily        10/19/21 2124                 Damyn Weitzel A, PA-C 10/19/21 2125    Ernie Avena, MD 10/20/21 0206

## 2021-10-19 NOTE — Discharge Instructions (Signed)
Make sure to follow-up with your results on MyChart  Return for new or worsening symptoms

## 2021-10-19 NOTE — ED Notes (Signed)
Pt provided supplies to self swab

## 2021-10-19 NOTE — ED Provider Triage Note (Signed)
Emergency Medicine Provider Triage Evaluation Note  Brianna Mahoney , a 31 y.o. female  was evaluated in triage.  Pt complains of vaginal discharge.  Patient reports that she has noticed increase of clear vaginal discharge.  Patient is concern for possible STI.  Patient is sexually active with multiple female partners.  Patient does not use condoms for penetrative vaginal intercourse.  Patient is not on any birth control however reports that she has had a tubal ligation.  Review of Systems  Positive: Vaginal discharge Negative: Vaginal pain, vaginal bleeding, dysuria, hematuria, urinary urgency, abdominal pain  Physical Exam  BP 115/83 (BP Location: Left Arm)   Pulse 87   Temp 98.7 F (37.1 C) (Oral)   Resp 16   Ht 5\' 5"  (1.651 m)   Wt 77.1 kg   LMP 09/16/2021 (Approximate)   SpO2 97%   BMI 28.29 kg/m  Gen:   Awake, no distress   Resp:  Normal effort  MSK:   Moves extremities without difficulty  Other:  Abdomen soft, nondistended, nontender with no guarding or rebound tenderness.  Medical Decision Making  Medically screening exam initiated at 2:00 PM.  Appropriate orders placed.  Brianna Mahoney was informed that the remainder of the evaluation will be completed by another provider, this initial triage assessment does not replace that evaluation, and the importance of remaining in the ED until their evaluation is complete.     Brianna Mahoney, Brianna Mahoney 10/19/21 1401

## 2021-10-19 NOTE — ED Triage Notes (Signed)
Pt requesting to be checked for STDs, reports increased clear vaginal discharge. Denies any known exposure

## 2021-10-20 LAB — GC/CHLAMYDIA PROBE AMP (~~LOC~~) NOT AT ARMC
Chlamydia: NEGATIVE
Comment: NEGATIVE
Comment: NORMAL
Neisseria Gonorrhea: NEGATIVE

## 2021-10-20 LAB — RPR: RPR Ser Ql: NONREACTIVE

## 2021-11-14 ENCOUNTER — Telehealth: Payer: Medicaid Other | Admitting: Nurse Practitioner

## 2021-11-14 DIAGNOSIS — B3731 Acute candidiasis of vulva and vagina: Secondary | ICD-10-CM | POA: Diagnosis not present

## 2021-11-14 MED ORDER — FLUCONAZOLE 150 MG PO TABS: 150.0000 mg | ORAL_TABLET | Freq: Once | ORAL | 0 refills | Status: AC

## 2021-11-14 NOTE — Progress Notes (Signed)

## 2021-12-26 ENCOUNTER — Telehealth: Payer: Medicaid Other | Admitting: Physician Assistant

## 2021-12-26 DIAGNOSIS — J019 Acute sinusitis, unspecified: Secondary | ICD-10-CM

## 2021-12-26 DIAGNOSIS — B9789 Other viral agents as the cause of diseases classified elsewhere: Secondary | ICD-10-CM

## 2021-12-26 MED ORDER — FLUTICASONE PROPIONATE 50 MCG/ACT NA SUSP: 2.0000 | Freq: Every day | NASAL | 0 refills | Status: AC

## 2021-12-26 NOTE — Progress Notes (Signed)

## 2021-12-26 NOTE — Progress Notes (Signed)
I have spent 5 minutes in review of e-visit questionnaire, review and updating patient chart, medical decision making and response to patient.   Thaine Garriga Cody Tika Hannis, PA-C    

## 2022-01-01 ENCOUNTER — Telehealth: Payer: Medicaid Other | Admitting: Physician Assistant

## 2022-01-01 DIAGNOSIS — B3731 Acute candidiasis of vulva and vagina: Secondary | ICD-10-CM

## 2022-01-01 MED ORDER — FLUCONAZOLE 150 MG PO TABS: 150.0000 mg | ORAL_TABLET | Freq: Once | ORAL | 0 refills | Status: AC

## 2022-01-01 NOTE — Progress Notes (Signed)

## 2022-01-02 ENCOUNTER — Other Ambulatory Visit: Payer: Self-pay | Admitting: Family

## 2022-01-02 MED ORDER — METRONIDAZOLE 500 MG PO TABS: 500.0000 mg | ORAL_TABLET | Freq: Two times a day (BID) | ORAL | 0 refills | Status: DC

## 2022-01-19 ENCOUNTER — Ambulatory Visit
Admission: RE | Admit: 2022-01-19 | Discharge: 2022-01-19 | Disposition: A | Discharge status: Home / Self Care | Payer: Medicaid Other | Source: Ambulatory Visit | Attending: Emergency Medicine | Admitting: Emergency Medicine

## 2022-01-19 VITALS — BP 104/68 | HR 102 | Temp 98.2°F | Resp 16

## 2022-01-19 DIAGNOSIS — N760 Acute vaginitis: Secondary | ICD-10-CM | POA: Diagnosis not present

## 2022-01-19 DIAGNOSIS — Z113 Encounter for screening for infections with a predominantly sexual mode of transmission: Secondary | ICD-10-CM | POA: Diagnosis present

## 2022-01-19 LAB — POCT URINALYSIS DIP (MANUAL ENTRY)
Bilirubin, UA: NEGATIVE
Blood, UA: NEGATIVE
Glucose, UA: NEGATIVE mg/dL
Ketones, POC UA: NEGATIVE mg/dL
Leukocytes, UA: NEGATIVE
Nitrite, UA: NEGATIVE
Protein Ur, POC: NEGATIVE mg/dL
Spec Grav, UA: 1.02 (ref 1.010–1.025)
Urobilinogen, UA: 0.2 E.U./dL
pH, UA: 6.5 (ref 5.0–8.0)

## 2022-01-19 LAB — POCT URINE PREGNANCY: Preg Test, Ur: NEGATIVE

## 2022-01-19 NOTE — ED Provider Notes (Signed)
[facility]    CSN: [id number] Arrival date & time: 01/19/22  1034    HISTORY   Chief Complaint  Patient presents with   SEXUALLY TRANSMITTED DISEASE   HPI Brianna Mahoney is a pleasant, 31 y.o. female who presents to urgent care today. The patient is requesting to be tested for STDs (with blood work).  Complains of a 1 week history of vaginal odor.  EMR reviewed, patient has a history of BV, last diagnosed October 2023 via telehealth visit, was provided with prescription for metronidazole which she states she did not finish.  She has not tried anything else to alleviate her symptoms.  Patient endorses fishy vaginal odor.  Patient denies burning with urination, increased frequency of urination, suprapubic pain, perineal pain, flank pain, fever, chills, malaise, rigors, significant fatigue, vaginal itching, vaginal irritation, dyspareunia, genital lesion(s), known exposure to STD, and possible exposure to STD.     The history is provided by the patient.   Past Medical History:  Diagnosis Date   Gestational diabetes    Patient Active Problem List   Diagnosis Date Noted   Iron deficiency anemia 11/02/2019   S/P cesarean section 11/01/2019   Gestational diabetes mellitus (GDM), antepartum 10/12/2019   Past Surgical History:  Procedure Laterality Date   CESAREAN SECTION     CESAREAN SECTION WITH BILATERAL TUBAL LIGATION Bilateral 11/01/2019   Procedure: CESAREAN SECTION WITH BILATERAL TUBAL LIGATION;  Surgeon: Jaymes Graff, MD;  Location: [facility];  Service: Obstetrics;  Laterality: Bilateral;   OB History     Gravida  4   Para  4   Term  4   Preterm      AB      Living  4      SAB      IAB      Ectopic      Multiple  0   Live Births  1          Home Medications    Prior to Admission medications   Medication Sig Start Date End Date Taking? Authorizing Provider  acetaminophen (TYLENOL) 325 MG tablet Take 2 tablets (650 mg total) by mouth  every 4 (four) hours as needed for mild pain (temperature > 101.5.). 11/03/19   June Leap, CNM  coconut oil OIL Apply 1 application topically as needed. 11/03/19   June Leap, CNM  fluticasone (FLONASE) 50 MCG/ACT nasal spray Place 2 sprays into both nostrils daily. 12/26/21   Waldon Merl, PA-C  ibuprofen (ADVIL) 600 MG tablet Take 1 tablet (600 mg total) by mouth every 6 (six) hours as needed for moderate pain. 09/12/21   Jarold Motto, PA  iron polysaccharides (NIFEREX) 150 MG capsule Take 1 capsule (150 mg total) by mouth daily. 11/04/19   June Leap, CNM    Family History Family History  Problem Relation Age of Onset   Multiple sclerosis Father    Diabetes Maternal Grandmother    Social History Social History   Tobacco Use   Smoking status: Never   Smokeless tobacco: Never  Vaping Use   Vaping Use: Former  Substance Use Topics   Alcohol use: Not Currently   Drug use: Never   Allergies   Patient has no known allergies.  Review of Systems Review of Systems Pertinent findings revealed after performing a 14 point review of systems has been noted in the history of present illness.  Physical Exam Triage Vital Signs ED Triage Vitals  Enc  Vitals Group     BP 01/02/21 0827 (!) 147/82     Pulse Rate 01/02/21 0827 72     Resp 01/02/21 0827 18     Temp 01/02/21 0827 98.3 F (36.8 C)     Temp Source 01/02/21 0827 Oral     SpO2 01/02/21 0827 98 %     Weight --      Height --      Head Circumference --      Peak Flow --      Pain Score 01/02/21 0826 5     Pain Loc --      Pain Edu? --      Excl. in GC? --   No data found.  Updated Vital Signs BP 104/68 (BP Location: Right Arm)   Pulse (!) 102   Temp 98.2 F (36.8 C) (Oral)   Resp 16   SpO2 98%   Breastfeeding No   Physical Exam Vitals and nursing note reviewed.  Constitutional:      General: She is not in acute distress.    Appearance: Normal appearance. She is not ill-appearing.  HENT:      Head: Normocephalic and atraumatic.  Eyes:     General: Lids are normal.        Right eye: No discharge.        Left eye: No discharge.     Extraocular Movements: Extraocular movements intact.     Conjunctiva/sclera: Conjunctivae normal.     Right eye: Right conjunctiva is not injected.     Left eye: Left conjunctiva is not injected.  Neck:     Trachea: Trachea and phonation normal.  Cardiovascular:     Rate and Rhythm: Normal rate and regular rhythm.     Pulses: Normal pulses.     Heart sounds: Normal heart sounds. No murmur heard.    No friction rub. No gallop.  Pulmonary:     Effort: Pulmonary effort is normal. No accessory muscle usage, prolonged expiration or respiratory distress.     Breath sounds: Normal breath sounds. No stridor, decreased air movement or transmitted upper airway sounds. No decreased breath sounds, wheezing, rhonchi or rales.  Chest:     Chest wall: No tenderness.  Genitourinary:    Comments: Patient politely declines pelvic exam today, patient provided a vaginal swab for testing. Musculoskeletal:        General: Normal range of motion.     Cervical back: Normal range of motion and neck supple. Normal range of motion.  Lymphadenopathy:     Cervical: No cervical adenopathy.  Skin:    General: Skin is warm and dry.     Findings: No erythema or rash.  Neurological:     General: No focal deficit present.     Mental Status: She is alert and oriented to person, place, and time.  Psychiatric:        Mood and Affect: Mood normal.        Behavior: Behavior normal.     Visual Acuity Right Eye Distance:   Left Eye Distance:   Bilateral Distance:    Right Eye Near:   Left Eye Near:    Bilateral Near:     [facility] / Diagnostics / Procedures:   Clinical Course as of 01/19/22 1141  Tue Jan 19, 2022  1131 Urobilinogen, UA: 0.2 [LM]    Clinical Course User Index [LM] Theadora Rama Scales, PA-C    Radiology No results  found.  Procedures Procedures (including  critical care time) EKG  Pending results:  Labs Reviewed  HIV ANTIBODY (ROUTINE TESTING W REFLEX)  RPR  POCT URINALYSIS DIP (MANUAL ENTRY)  POCT URINE PREGNANCY  CERVICOVAGINAL ANCILLARY ONLY    Medications Ordered in UC: Medications - No data to display  UC Diagnoses / Final Clinical Impressions(s)   I have reviewed the triage vital signs and the nursing notes.  Pertinent labs & imaging results that were available during my care of the patient were reviewed by me and considered in my medical decision making (see chart for details).    Final diagnoses:  Screening examination for STD (sexually transmitted disease)  Vaginitis and vulvovaginitis    {LMSTDP:27058}  {LMUTIP:27060}  Patient states that if she test positive for BV, she would prefer to have metronidazole gel for treatment then metronidazole tablets which she states upset her stomach.  ED Prescriptions   None    PDMP not reviewed this encounter.  Disposition Upon Discharge:  Condition: stable for discharge home  Patient presented with concern for an acute illness with associated systemic symptoms and significant discomfort requiring urgent management. In my opinion, this is a condition that a prudent lay person (someone who possesses an average knowledge of health and medicine) may potentially expect to result in complications if not addressed urgently such as respiratory distress, impairment of bodily function or dysfunction of bodily organs.   As such, the patient has been evaluated and assessed, work-up was performed and treatment was provided in alignment with urgent care protocols and evidence based medicine.  Patient/parent/caregiver has been advised that the patient may require follow up for further testing and/or treatment if the symptoms continue in spite of treatment, as clinically indicated and appropriate.  Routine symptom specific, illness specific and/or  disease specific instructions were discussed with the patient and/or caregiver at length.  Prevention strategies for avoiding STD exposure were also discussed.  The patient will follow up with their current PCP if and as advised. If the patient does not currently have a PCP we will assist them in obtaining one.   The patient may need specialty follow up if the symptoms continue, in spite of conservative treatment and management, for further workup, evaluation, consultation and treatment as clinically indicated and appropriate.  Patient/parent/caregiver verbalized understanding and agreement of plan as discussed.  All questions were addressed during visit.  Please see discharge instructions below for further details of plan.  Discharge Instructions:   Discharge Instructions      The results of your vaginal swab test which screens for BV, yeast, gonorrhea, chlamydia and trichomonas will be made posted to your MyChart account once it is complete.  This typically takes 2 to 4 days.  Please abstain from sexual intercourse of any kind, vaginal, oral or anal, until you have received the results of your STD testing.     The results of your HIV and syphilis blood tests will be made available to you once they are complete.  They will initially be posted to your MyChart account which typically takes 2 to 3 days.     If any of your results are abnormal, you will receive a phone call regarding treatment.  Prescriptions, if any are needed, will be provided for you at your pharmacy.  I have documented that you would prefer metronidazole gel if you need to be treated for BV.   Your urine pregnancy test today is negative.   Our point-of-care analysis of your urine sample today was normal and did not  reveal any concern for urinary tract infection.   Please remember that your body is a [place] and you are the Goddess.  The only way to prevent transmission of sexually transmitted disease when having sexual  intercourse is to use condoms.  Repeat sexually transmitted infections can cause scarring in your fallopian tubes which will interfere with your ability to have children.  Repeat exposures to sexually transmitted diseases can also increase your risk of contracting HPV, the human papilloma virus which causes cervical cancer and genital warts and HIV.   If you have not had complete resolution of your symptoms after completing any needed treatment, please return for repeat evaluation.   Thank you for visiting urgent care today.  I appreciate the opportunity to participate in your care.       This office note has been dictated using Teaching laboratory technicianDragon speech recognition software.  Unfortunately, this method of dictation can sometimes lead to typographical or grammatical errors.  I apologize for your inconvenience in advance if this occurs.  Please do not hesitate to reach out to me if clarification is needed.

## 2022-01-19 NOTE — Discharge Instructions (Signed)
The results of your vaginal swab test which screens for BV, yeast, gonorrhea, chlamydia and trichomonas will be made posted to your MyChart account once it is complete.  This typically takes 2 to 4 days.  Please abstain from sexual intercourse of any kind, vaginal, oral or anal, until you have received the results of your STD testing.     The results of your HIV and syphilis blood tests will be made available to you once they are complete.  They will initially be posted to your MyChart account which typically takes 2 to 3 days.     If any of your results are abnormal, you will receive a phone call regarding treatment.  Prescriptions, if any are needed, will be provided for you at your pharmacy.  I have documented that you would prefer metronidazole gel if you need to be treated for BV.   Your urine pregnancy test today is negative.   Our point-of-care analysis of your urine sample today was normal and did not reveal any concern for urinary tract infection.   Please remember that your body is a [place] and you are the Goddess.  The only way to prevent transmission of sexually transmitted disease when having sexual intercourse is to use condoms.  Repeat sexually transmitted infections can cause scarring in your fallopian tubes which will interfere with your ability to have children.  Repeat exposures to sexually transmitted diseases can also increase your risk of contracting HPV, the human papilloma virus which causes cervical cancer and genital warts and HIV.   If you have not had complete resolution of your symptoms after completing any needed treatment, please return for repeat evaluation.   Thank you for visiting urgent care today.  I appreciate the opportunity to participate in your care.

## 2022-01-19 NOTE — ED Triage Notes (Signed)
The patient is requesting to be tested for STDs (with blood work). The patient states she does have a vaginal odor.   Started: about a week ago   Home interventions: none

## 2022-01-20 LAB — RPR: RPR Ser Ql: NONREACTIVE

## 2022-01-20 LAB — CERVICOVAGINAL ANCILLARY ONLY
Bacterial Vaginitis (gardnerella): POSITIVE — AB
Candida Glabrata: NEGATIVE
Candida Vaginitis: POSITIVE — AB
Chlamydia: NEGATIVE
Comment: NEGATIVE
Comment: NEGATIVE
Comment: NEGATIVE
Comment: NEGATIVE
Comment: NEGATIVE
Comment: NORMAL
Neisseria Gonorrhea: NEGATIVE
Trichomonas: NEGATIVE

## 2022-01-20 LAB — HIV ANTIBODY (ROUTINE TESTING W REFLEX): HIV Screen 4th Generation wRfx: NONREACTIVE

## 2022-01-21 ENCOUNTER — Telehealth (HOSPITAL_COMMUNITY): Payer: Self-pay | Admitting: Emergency Medicine

## 2022-01-21 ENCOUNTER — Telehealth: Payer: Self-pay | Admitting: Emergency Medicine

## 2022-01-21 MED ORDER — METRONIDAZOLE 0.75 % VA GEL: 1.0000 | Freq: Every day | VAGINAL | 0 refills | Status: AC

## 2022-01-21 MED ORDER — FLUCONAZOLE 150 MG PO TABS: 150.0000 mg | ORAL_TABLET | Freq: Once | ORAL | 0 refills | Status: AC

## 2022-01-21 NOTE — Telephone Encounter (Signed)
Encounter opened in error, please disregard

## 2022-03-21 ENCOUNTER — Telehealth: Payer: Medicaid Other | Admitting: Family Medicine

## 2022-03-21 DIAGNOSIS — N760 Acute vaginitis: Secondary | ICD-10-CM

## 2022-03-21 DIAGNOSIS — B9689 Other specified bacterial agents as the cause of diseases classified elsewhere: Secondary | ICD-10-CM | POA: Diagnosis not present

## 2022-03-21 MED ORDER — TINIDAZOLE 500 MG PO TABS: 1.0000 g | ORAL_TABLET | Freq: Every day | ORAL | 0 refills | Status: AC

## 2022-03-21 NOTE — Progress Notes (Signed)
E-Visit for Vaginal Symptoms  We are sorry that you are not feeling well. Here is how we plan to help! Based on what you shared with me it looks like you: May have a vaginosis due to bacteria  Vaginosis is an inflammation of the vagina that can result in discharge, itching and pain. The cause is usually a change in the normal balance of vaginal bacteria or an infection. Vaginosis can also result from reduced estrogen levels after menopause.  The most common causes of vaginosis are:   Bacterial vaginosis which results from an overgrowth of one on several organisms that are normally present in your vagina.   Yeast infections which are caused by a naturally occurring fungus called candida.   Vaginal atrophy (atrophic vaginosis) which results from the thinning of the vagina from reduced estrogen levels after menopause.   Trichomoniasis which is caused by a parasite and is commonly transmitted by sexual intercourse.  Factors that increase your risk of developing vaginosis include: Medications, such as antibiotics and steroids Uncontrolled diabetes Use of hygiene products such as bubble bath, vaginal spray or vaginal deodorant Douching Wearing damp or tight-fitting clothing Using an intrauterine device (IUD) for birth control Hormonal changes, such as those associated with pregnancy, birth control pills or menopause Sexual activity Having a sexually transmitted infection  Your treatment plan is tinidazole 1 gram daily for 5 days.  Be sure to take all of the medication as directed. Stop taking any medication if you develop a rash, tongue swelling or shortness of breath. Mothers who are breast feeding should consider pumping and discarding their breast milk while on these antibiotics. However, there is no consensus that infant exposure at these doses would be harmful.  Remember that medication creams can weaken latex condoms. Marland Kitchen   HOME CARE:  Good hygiene may prevent some types of  vaginosis from recurring and may relieve some symptoms:  Avoid baths, hot tubs and whirlpool spas. Rinse soap from your outer genital area after a shower, and dry the area well to prevent irritation. Don't use scented or harsh soaps, such as those with deodorant or antibacterial action. Avoid irritants. These include scented tampons and pads. Wipe from front to back after using the toilet. Doing so avoids spreading fecal bacteria to your vagina.  Other things that may help prevent vaginosis include:  Don't douche. Your vagina doesn't require cleansing other than normal bathing. Repetitive douching disrupts the normal organisms that reside in the vagina and can actually increase your risk of vaginal infection. Douching won't clear up a vaginal infection. Use a latex condom. Both female and female latex condoms may help you avoid infections spread by sexual contact. Wear cotton underwear. Also wear pantyhose with a cotton crotch. If you feel comfortable without it, skip wearing underwear to bed. Yeast thrives in [employer] Your symptoms should improve in the next day or two.  GET HELP RIGHT AWAY IF:  You have pain in your lower abdomen ( pelvic area or over your ovaries) You develop nausea or vomiting You develop a fever Your discharge changes or worsens You have persistent pain with intercourse You develop shortness of breath, a rapid pulse, or you faint.  These symptoms could be signs of problems or infections that need to be evaluated by a medical provider now.  MAKE SURE YOU   Understand these instructions. Will watch your condition. Will get help right away if you are not doing well or get worse.  Thank you for choosing an e-visit.  Your e-visit answers were reviewed by a board certified advanced clinical practitioner to complete your personal care plan. Depending upon the condition, your plan could have included both over the counter or prescription medications.  Please  review your pharmacy choice. Make sure the pharmacy is open so you can pick up prescription now. If there is a problem, you may contact your provider through [employer] and have the prescription routed to another pharmacy.  Your safety is important to [place]. If you have drug allergies check your prescription carefully.   For the next 24 hours you can use MyChart to ask questions about today's visit, request a non-urgent call back, or ask for a work or school excuse. You will get an email in the next two days asking about your experience. I hope that your e-visit has been valuable and will speed your recovery.   I have spent 5 minutes in review of e-visit questionnaire, review and updating patient chart, medical decision making and response to patient.   Mar Daring, PA-C

## 2022-05-01 IMAGING — US US MFM OB FOLLOW-UP
1 series · 14 of 28 positions shown · non-contrast
Comparison: none

[Series 1: us mfm ob follow-up · 57 acquisitions, 14 frames shown]
[im 3/57]
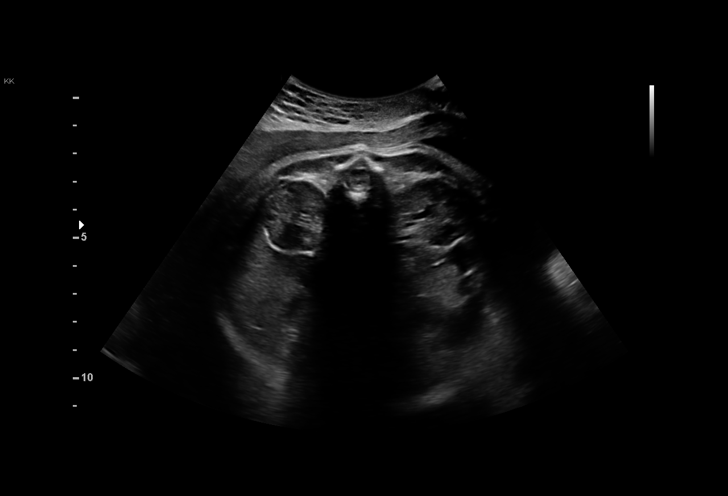
[im 7/57]
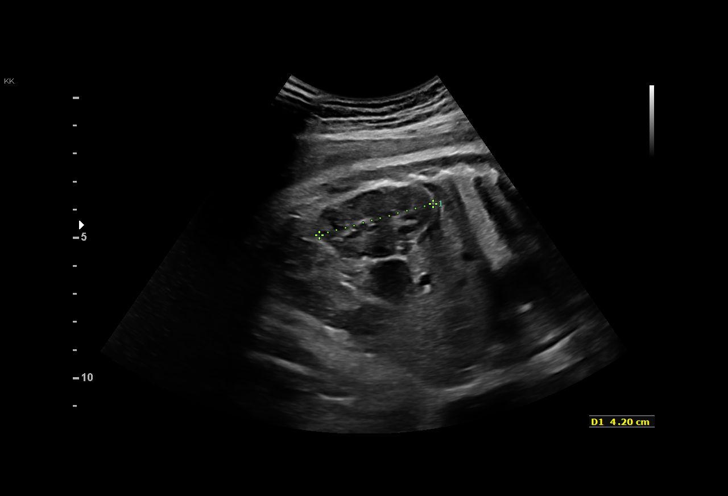
[im 11/57]
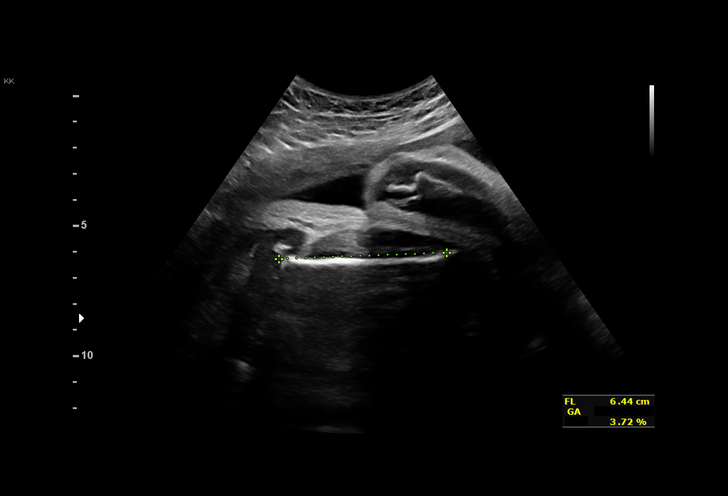
[im 15/57]
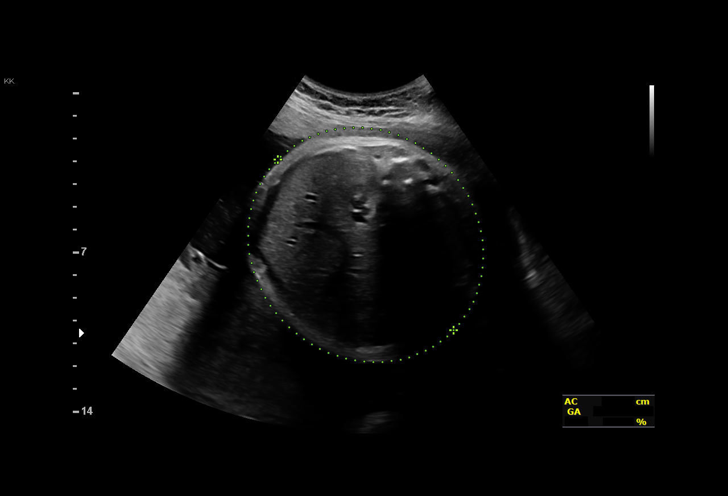
[im 19/57]
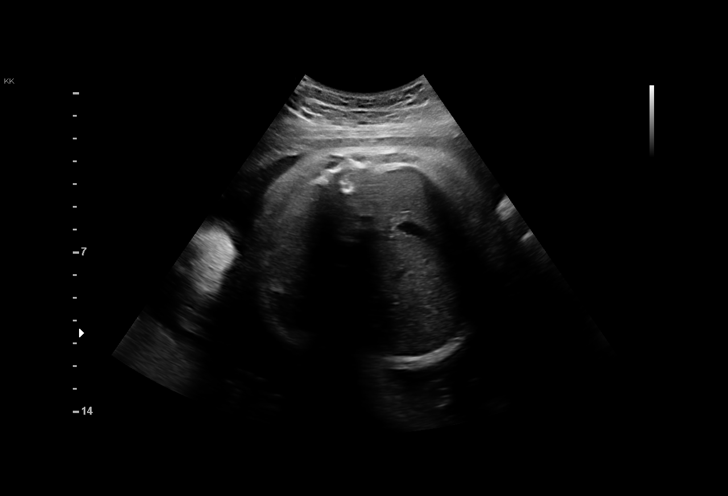
[im 23/57]
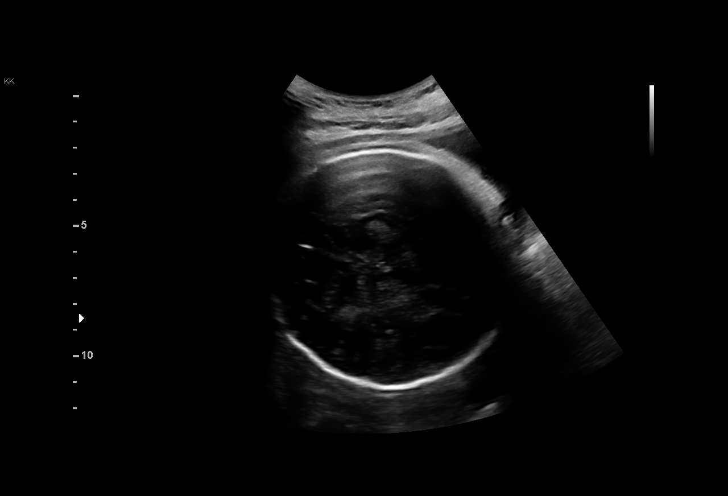
[im 27/57]
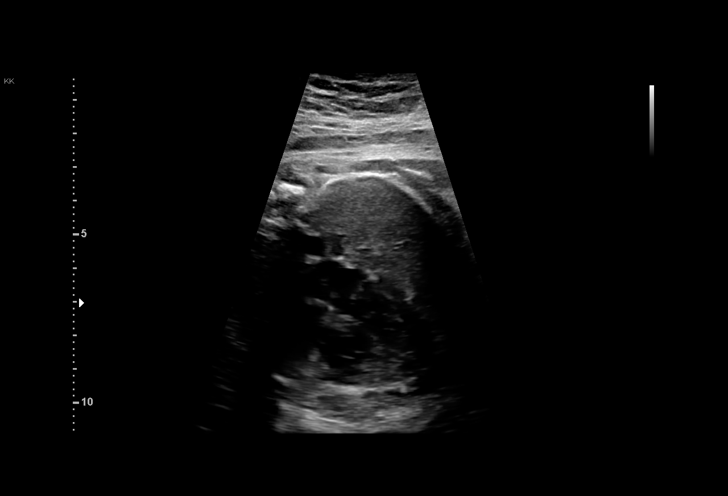
[im 32/57]
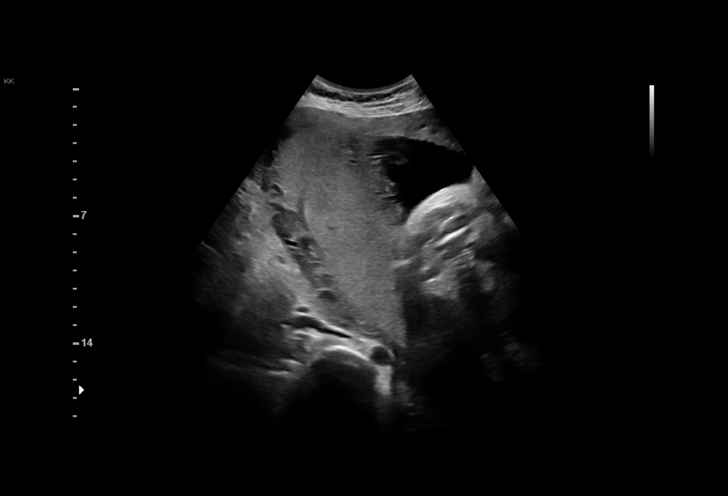
[im 36/57]
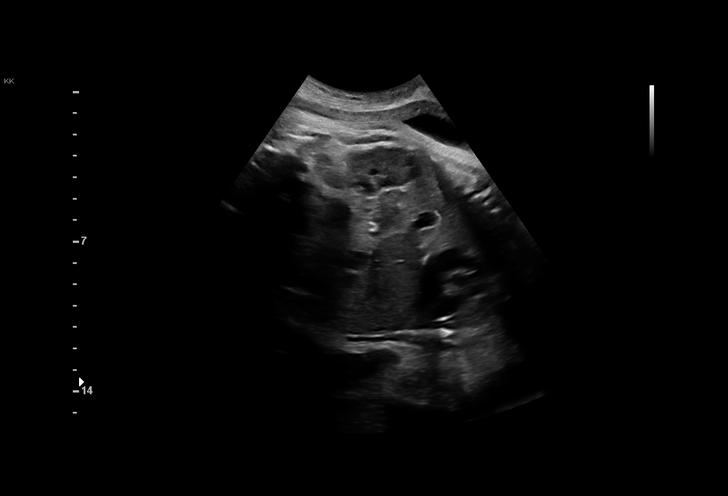
[im 40/57]
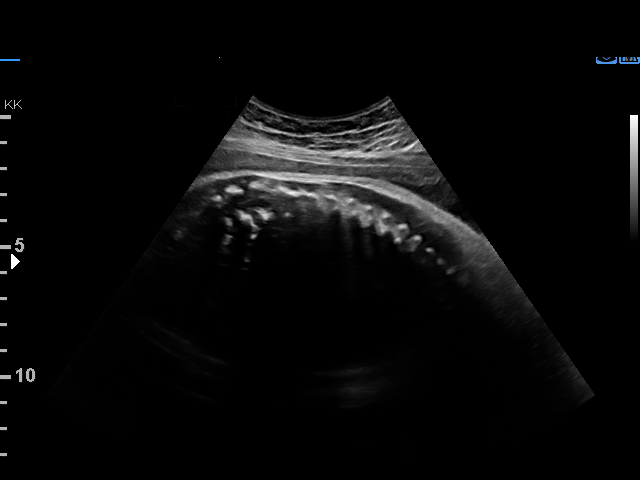
[im 44/57]
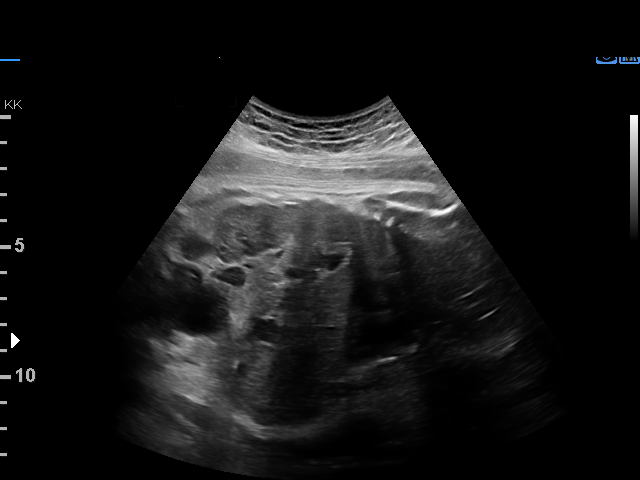
[im 48/57]
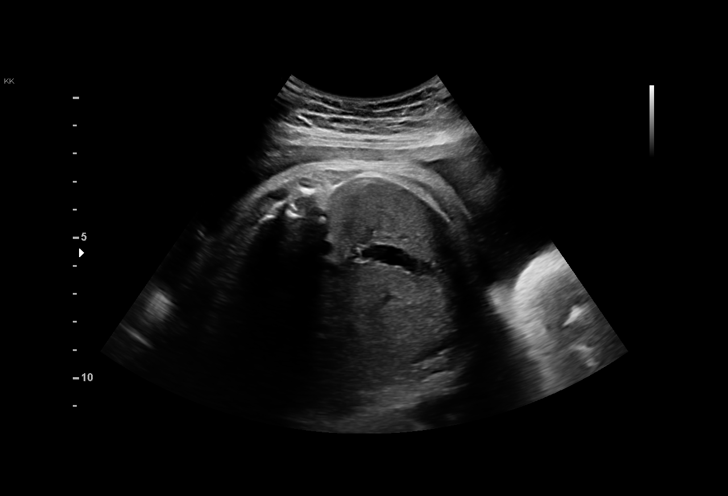
[im 52/57]
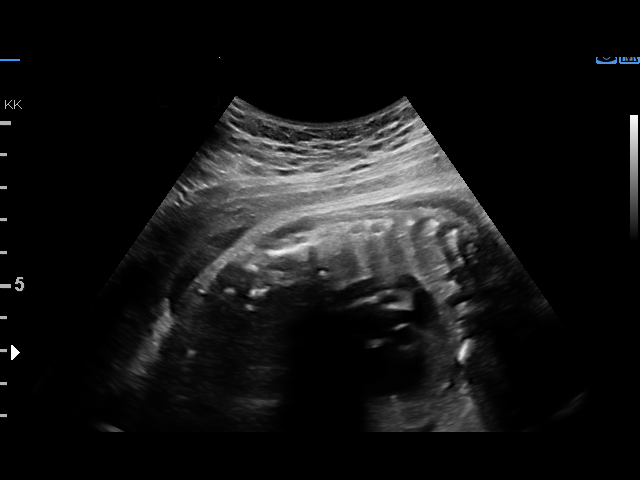
[im 57/57]
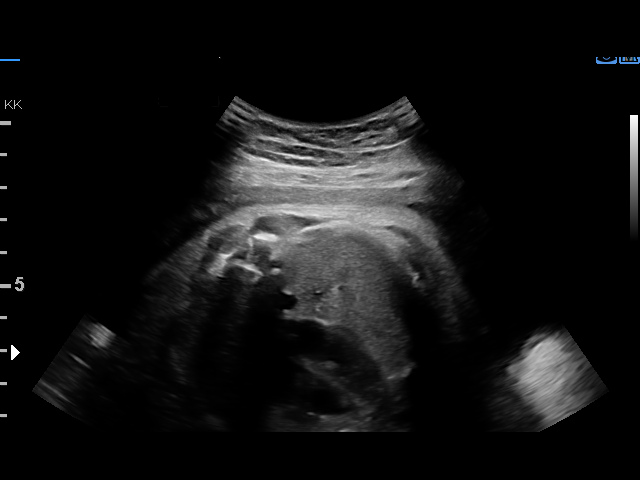

[14 of 28 positions shown; findings below may reference images not displayed]

OB

Indications

 Gestational diabetes in pregnancy, diet
 controlled
 34 weeks gestation of pregnancy
 Insufficient Prenatal Care
 Previous cesarean delivery, antepartum
 History of sickle cell trait
 HIgh hemoglobin A1c
 Low Risk NIPS
Fetal Evaluation

 Num Of Fetuses:         1
 Fetal Heart Rate(bpm):  138
 Cardiac Activity:       Observed
 Presentation:           Cephalic
 Placenta:               Posterior
 P. Cord Insertion:      Not well visualized

 Amniotic Fluid
 AFI FV:      Within normal limits

 AFI Sum(cm)     %Tile       Largest Pocket(cm)
 18.8            70

 RUQ(cm)       RLQ(cm)       LUQ(cm)        LLQ(cm)
 5             2
Biometry

 BPD:      89.6  mm     G. Age:  36w 2d         88  %    CI:        81.58   %    70 - 86
                                                         FL/HC:      21.7   %    20.1 -
 HC:      313.1  mm     G. Age:  35w 1d         24  %    HC/AC:      0.95        0.93 -
 AC:      329.1  mm     G. Age:  36w 6d         96  %    FL/BPD:     75.8   %    71 - 87
 FL:       67.9  mm     G. Age:  34w 6d         47  %    FL/AC:      20.6   %    20 - 24

 Est. FW:    9138  gm      6 lb 4 oz     83  %
OB History

 Gravidity:    4         Term:   3        Prem:   0        SAB:   0
 TOP:          0       Ectopic:  0        Living: 3
Gestational Age

 U/S Today:     35w 6d                                        EDD:   10/31/19
 Best:          34w 5d     Det. By:  Early Ultrasound         EDD:   11/08/19
                                     (04/09/19)
Anatomy

 Cranium:               Appears normal         Aortic Arch:            Previously seen
 Cavum:                 Previously seen        Ductal Arch:            Previously seen
 Ventricles:            Appears normal         Diaphragm:              Appears normal
 Choroid Plexus:        Previously seen        Stomach:                Appears normal, left
                                                                       sided
 Cerebellum:            Previously seen        Abdomen:                Appears normal
 Posterior Fossa:       Not well visualized    Abdominal Wall:         Not well visualized
 Nuchal Fold:           Previously seen        Cord Vessels:           Previously seen
 Face:                  Orbits and profile     Kidneys:                Appear normal
                        previously seen
 Lips:                  Previously seen        Bladder:                Appears normal
 Thoracic:              Appears normal         Spine:                  Previously seen
 Heart:                 Appears normal         Upper Extremities:      Previously seen
                        (4CH, axis, and
                        situs)
 RVOT:                  Previously seen        Lower Extremities:      Previously seen
 LVOT:                  Previously seen

 Other:  Technically difficult due to advanced gestational age.
Cervix Uterus Adnexa

 Cervix
 Not visualized (advanced GA >74wks)
Impression

 Patient is here for fetal growth assessment.  She reports she
 does not have gestational diabetes.  She has been checking
 her blood glucose because of prediabetes and they are within
 normal range.
 Her EDD (11/08/2019) is based on ultrasound performed at 9
 weeks gestation.

 Amniotic fluid is normal and good fetal activity is seen .Fetal
 growth is appropriate for gestational age .
Recommendations

 Patient has follow-up appointments for ultrasound at your
 office.
 She is planning to have repeat cesarean delivery.
                 Vogler, Fadi

## 2022-08-02 ENCOUNTER — Encounter: Payer: Medicaid Other | Admitting: Nurse Practitioner

## 2022-08-02 ENCOUNTER — Telehealth: Payer: Medicaid Other | Admitting: Physician Assistant

## 2022-08-02 DIAGNOSIS — B9689 Other specified bacterial agents as the cause of diseases classified elsewhere: Secondary | ICD-10-CM

## 2022-08-02 DIAGNOSIS — H109 Unspecified conjunctivitis: Secondary | ICD-10-CM | POA: Diagnosis not present

## 2022-08-02 MED ORDER — POLYMYXIN B-TRIMETHOPRIM 10000-0.1 UNIT/ML-% OP SOLN: 1.0000 [drp] | OPHTHALMIC | 0 refills | Status: AC

## 2022-08-02 NOTE — Progress Notes (Signed)
Made visit for son- he is 32 years old

## 2022-08-02 NOTE — Progress Notes (Signed)

## 2022-08-02 NOTE — Patient Instructions (Signed)
[email or web address]

## 2023-12-07 ENCOUNTER — Other Ambulatory Visit: Payer: Self-pay

## 2023-12-07 ENCOUNTER — Encounter (HOSPITAL_COMMUNITY): Payer: Self-pay

## 2023-12-07 ENCOUNTER — Emergency Department (HOSPITAL_COMMUNITY)
Admission: EM | Admit: 2023-12-07 | Discharge: 2023-12-08 | Discharge status: Against Medical Advice | Attending: Emergency Medicine | Admitting: Emergency Medicine

## 2023-12-07 DIAGNOSIS — R109 Unspecified abdominal pain: Secondary | ICD-10-CM | POA: Insufficient documentation

## 2023-12-07 DIAGNOSIS — K921 Melena: Secondary | ICD-10-CM | POA: Diagnosis not present

## 2023-12-07 DIAGNOSIS — Z5321 Procedure and treatment not carried out due to patient leaving prior to being seen by health care provider: Secondary | ICD-10-CM | POA: Insufficient documentation

## 2023-12-07 LAB — COMPREHENSIVE METABOLIC PANEL WITH GFR
ALT: 24 U/L (ref 0–44)
AST: 22 U/L (ref 15–41)
Albumin: 3.5 g/dL (ref 3.5–5.0)
Alkaline Phosphatase: 64 U/L (ref 38–126)
Anion gap: 10 (ref 5–15)
BUN: 7 mg/dL (ref 6–20)
CO2: 24 mmol/L (ref 22–32)
Calcium: 9 mg/dL (ref 8.9–10.3)
Chloride: 105 mmol/L (ref 98–111)
Creatinine, Ser: 0.68 mg/dL (ref 0.44–1.00)
GFR, Estimated: >60 mL/min (ref >60)
Glucose, Bld: 130 mg/dL — ABNORMAL HIGH (ref 70–99)
Potassium: 3.7 mmol/L (ref 3.5–5.1)
Sodium: 139 mmol/L (ref 135–145)
Total Bilirubin: 0.2 mg/dL (ref 0.0–1.2)
Total Protein: 6.7 g/dL (ref 6.5–8.1)

## 2023-12-07 LAB — CBC
HCT: 38.3 % (ref 36.0–46.0)
Hemoglobin: 12.7 g/dL (ref 12.0–15.0)
MCH: 28.9 pg (ref 26.0–34.0)
MCHC: 33.2 g/dL (ref 30.0–36.0)
MCV: 87 fL (ref 80.0–100.0)
Platelets: 173 K/uL (ref 150–400)
RBC: 4.4 MIL/uL (ref 3.87–5.11)
RDW: 12.1 % (ref 11.5–15.5)
WBC: 4.8 K/uL (ref 4.0–10.5)
nRBC: 0 % (ref 0.0–0.2)

## 2023-12-07 LAB — TYPE AND SCREEN
ABO/RH(D): O POS
Antibody Screen: NEGATIVE

## 2023-12-07 LAB — LIPASE, BLOOD: Lipase: 19 U/L (ref 11–51)

## 2023-12-07 LAB — HCG, SERUM, QUALITATIVE: Preg, Serum: NEGATIVE

## 2023-12-07 NOTE — ED Notes (Signed)
 PT reports she is going to go home.

## 2023-12-07 NOTE — ED Notes (Signed)
 Called for vitals x3, no response. Will try again shortly.

## 2023-12-07 NOTE — ED Triage Notes (Signed)
 Pt eating [employer] during triage, pt able to tolerate food and drink at this time.

## 2023-12-07 NOTE — ED Triage Notes (Addendum)
 Pt arrives via POV. Pt c/o abdominal pain and diarrhea for the past 3 days. Reports bright red blood in her stool today. PT is AxOx4. Denies being on blood thinners.

## 2023-12-15 ENCOUNTER — Ambulatory Visit (HOSPITAL_COMMUNITY)
Admission: EM | Admit: 2023-12-15 | Discharge: 2023-12-15 | Disposition: A | Discharge status: Home / Self Care | Attending: Nurse Practitioner | Admitting: Nurse Practitioner

## 2023-12-15 ENCOUNTER — Encounter (HOSPITAL_COMMUNITY): Payer: Self-pay

## 2023-12-15 DIAGNOSIS — Z202 Contact with and (suspected) exposure to infections with a predominantly sexual mode of transmission: Secondary | ICD-10-CM | POA: Diagnosis not present

## 2023-12-15 DIAGNOSIS — Z113 Encounter for screening for infections with a predominantly sexual mode of transmission: Secondary | ICD-10-CM | POA: Diagnosis present

## 2023-12-15 LAB — POCT URINE PREGNANCY: Preg Test, Ur: NEGATIVE

## 2023-12-15 LAB — HIV ANTIBODY (ROUTINE TESTING W REFLEX): HIV Screen 4th Generation wRfx: NONREACTIVE

## 2023-12-15 MED ORDER — CEFTRIAXONE SODIUM 500 MG IJ SOLR
INTRAMUSCULAR | Status: AC
  Filled 2023-12-15: qty 500

## 2023-12-15 MED ORDER — CEFTRIAXONE SODIUM 500 MG IJ SOLR
500.0000 mg | INTRAMUSCULAR | Status: DC
  Administered 2023-12-15: 500 mg via INTRAMUSCULAR

## 2023-12-15 NOTE — ED Provider Notes (Signed)
 [facility]    CSN: [id number] Arrival date & time: 12/15/23  1401      History   Chief Complaint Chief Complaint  Patient presents with   SEXUALLY TRANSMITTED DISEASE    HPI Brianna Mahoney is a 33 y.o. female.   Discussed the use of AI scribe software for clinical note transcription with the patient, who gave verbal consent to proceed.   Patient presents for STD testing after being informed by a sexual partner that he tested positive for gonorrhea. The patient reports she currently has no symptoms but then reported some mild vaginal itching and odor. No irritation, discharge, or pain or burning with urination. The patient reports having three sexual partners in the past three months, all female, and used condoms with two of them but not with the partner who tested positive for gonorrhea. Last sexual encounter with him was a couple of weeks ago. She has a history of tubal ligation.  The following sections of the patient's history were reviewed and updated as appropriate: allergies, current medications, past family history, past medical history, past social history, past surgical history, and problem list.       Past Medical History:  Diagnosis Date   Gestational diabetes     Patient Active Problem List   Diagnosis Date Noted   Iron  deficiency anemia 11/02/2019   S/P cesarean section 11/01/2019   Gestational diabetes mellitus (GDM), antepartum 10/12/2019    Past Surgical History:  Procedure Laterality Date   CESAREAN SECTION     CESAREAN SECTION WITH BILATERAL TUBAL LIGATION Bilateral 11/01/2019   Procedure: CESAREAN SECTION WITH BILATERAL TUBAL LIGATION;  Surgeon: Armond Cape, MD;  Location: [facility];  Service: Obstetrics;  Laterality: Bilateral;    OB History     Gravida  4   Para  4   Term  4   Preterm      AB      Living  4      SAB      IAB      Ectopic      Multiple  0   Live Births  1            Home Medications     Prior to Admission medications   Medication Sig Start Date End Date Taking? Authorizing Provider  acetaminophen  (TYLENOL ) 325 MG tablet Take 2 tablets (650 mg total) by mouth every 4 (four) hours as needed for mild pain (temperature > 101.5.). 11/03/19   Jones, Amanda K, CNM  coconut oil OIL Apply 1 application topically as needed. 11/03/19   Jones, Amanda K, CNM  fluticasone  (FLONASE ) 50 MCG/ACT nasal spray Place 2 sprays into both nostrils daily. 12/26/21   Gladis Elsie BROCKS, PA-C  ibuprofen  (ADVIL ) 600 MG tablet Take 1 tablet (600 mg total) by mouth every 6 (six) hours as needed for moderate pain. 09/12/21   Job Lukes, PA  iron  polysaccharides (NIFEREX) 150 MG capsule Take 1 capsule (150 mg total) by mouth daily. 11/04/19   Jones, Amanda K, CNM  trimethoprim -polymyxin b  (POLYTRIM ) ophthalmic solution Place 1 drop into both eyes every 4 (four) hours. X 5 days 08/02/22   Vivienne Delon HERO, PA-C    Family History Family History  Problem Relation Age of Onset   Multiple sclerosis Father    Diabetes Maternal Grandmother     Social History Social History   Tobacco Use   Smoking status: Never   Smokeless tobacco: Never  Vaping Use  Vaping status: Former  Substance Use Topics   Alcohol use: Not Currently   Drug use: Yes    Types: Marijuana     Allergies   Patient has no known allergies.   Review of Systems Review of Systems  Gastrointestinal:  Negative for abdominal pain, nausea and vomiting.  Genitourinary:  Negative for dysuria, menstrual problem (LMP about 2 weeks ago) and vaginal discharge.       No vaginal irritation. Mild itching. Some vaginal odor.   All other systems reviewed and are negative.    Physical Exam Triage Vital Signs ED Triage Vitals [12/15/23 1512]  Encounter Vitals Group     BP 111/70     Girls Systolic BP Percentile      Girls Diastolic BP Percentile      Boys Systolic BP Percentile      Boys Diastolic BP Percentile      Pulse Rate 63      Resp 20     Temp 97.8 F (36.6 C)     Temp Source Oral     SpO2 97 %     Weight      Height      Head Circumference      Peak Flow      Pain Score      Pain Loc      Pain Education      Exclude from Growth Chart    No data found.  Updated Vital Signs BP 111/70 (BP Location: Left Arm)   Pulse 63   Temp 97.8 F (36.6 C) (Oral)   Resp 20   LMP 11/23/2023 (Approximate)   SpO2 97%   Visual Acuity Right Eye Distance:   Left Eye Distance:   Bilateral Distance:    Right Eye Near:   Left Eye Near:    Bilateral Near:     Physical Exam Constitutional:      General: She is not in acute distress.    Appearance: Normal appearance. She is not ill-appearing, toxic-appearing or diaphoretic.  HENT:     Head: Normocephalic.     Nose: Nose normal.     Mouth/Throat:     Mouth: Mucous membranes are moist.  Eyes:     Conjunctiva/sclera: Conjunctivae normal.  Cardiovascular:     Rate and Rhythm: Normal rate.  Pulmonary:     Effort: Pulmonary effort is normal.  Abdominal:     Palpations: Abdomen is soft.  Genitourinary:    Comments: Deferred; patient performed self-swab for Aptima testing  Musculoskeletal:        General: Normal range of motion.     Cervical back: Normal range of motion and neck supple.  Skin:    General: Skin is warm and dry.  Neurological:     General: No focal deficit present.     Mental Status: She is alert and oriented to person, place, and time.  Psychiatric:        Mood and Affect: Mood normal.        Behavior: Behavior normal.      UC Treatments / Results  Labs (all labs ordered are listed, but only abnormal results are displayed) Labs Reviewed  RPR  HIV ANTIBODY (ROUTINE TESTING W REFLEX)  POCT URINE PREGNANCY  CERVICOVAGINAL ANCILLARY ONLY    EKG   Radiology No results found.  Procedures Procedures (including critical care time)  Medications Ordered in UC Medications  cefTRIAXone  (ROCEPHIN ) injection 500 mg (has no  administration in time range)  Initial Impression / Assessment and Plan / UC Course  I have reviewed the triage vital signs and the nursing notes.  Pertinent labs & imaging results that were available during my care of the patient were reviewed by me and considered in my medical decision making (see chart for details).      The patient presents for STD evaluation after being notified that a recent sexual partner tested positive for gonorrhea. She reports no current symptoms but does endorse mild vaginal itching and odor without discharge, dysuria, or irritation. She has had three female partners in the past three months, using condoms with two but not with the partner who tested positive; last contact with him was a couple of weeks ago. History includes prior tubal ligation. A prophylactic dose of ceftriaxone  was administered for gonorrhea treatment. Vaginal swab was obtained for bacterial vaginosis, yeast, gonorrhea, chlamydia, and trichomonas, and blood testing was performed for HIV and syphilis. The patient was counseled to abstain from sexual activity until results are available and to practice safe sex going forward. She will be contacted if any results are positive, and otherwise may access results through the patient portal. Follow-up with primary care or gynecology is recommended as needed, and she was instructed to seek emergency care if she develops fever, severe pelvic pain, or other concerning symptoms.  Today's evaluation has revealed no signs of a dangerous process. Discussed diagnosis with patient and/or guardian. Patient and/or guardian aware of their diagnosis, possible red flag symptoms to watch out for and need for close follow up. Patient and/or guardian understands verbal and written discharge instructions. Patient and/or guardian comfortable with plan and disposition.  Patient and/or guardian has a clear mental status at this time, good insight into illness (after discussion and  teaching) and has clear judgment to make decisions regarding their care  Documentation was completed with the aid of voice recognition software. Transcription may contain typographical errors.   Final Clinical Impressions(s) / UC Diagnoses   Final diagnoses:  Exposure to gonorrhea  Screening for STD (sexually transmitted disease)     Discharge Instructions      You were treated today because a recent partner tested positive for gonorrhea. Even though your symptoms are mild, you were given an antibiotic injection to treat and prevent possible infection. A vaginal swab was collected to check for gonorrhea, chlamydia, trichomonas, bacterial vaginosis, and yeast, and blood work was done to test for HIV and syphilis. You will be called if any test comes back positive. If all results are negative, you can view them in your MyChart account. Please do not have sex until all test results are back and treatment is complete. After that, be sure to use condoms to help protect yourself and your partners. Follow up with your primary care provider or gynecologist if you have ongoing concerns or questions. Go to the emergency department right away if you develop severe pelvic or abdominal pain, fever, or any new or concerning symptoms.       ED Prescriptions   None    PDMP not reviewed this encounter.   Iola Lukes, [place] 12/15/23 1539

## 2023-12-15 NOTE — ED Triage Notes (Signed)
 Patient is here for STI testing. Patient denies any symptoms. Patient states she received a phone that her friend was positive for gonorrhea.

## 2023-12-15 NOTE — Discharge Instructions (Addendum)
 You were treated today because a recent partner tested positive for gonorrhea. Even though your symptoms are mild, you were given an antibiotic injection to treat and prevent possible infection. A vaginal swab was collected to check for gonorrhea, chlamydia, trichomonas, bacterial vaginosis, and yeast, and blood work was done to test for HIV and syphilis. You will be called if any test comes back positive. If all results are negative, you can view them in your MyChart account. Please do not have sex until all test results are back and treatment is complete. After that, be sure to use condoms to help protect yourself and your partners. Follow up with your primary care provider or gynecologist if you have ongoing concerns or questions. Go to the emergency department right away if you develop severe pelvic or abdominal pain, fever, or any new or concerning symptoms.

## 2023-12-16 LAB — CERVICOVAGINAL ANCILLARY ONLY
Bacterial Vaginitis (gardnerella): POSITIVE — AB
Candida Glabrata: NEGATIVE
Candida Vaginitis: POSITIVE — AB
Chlamydia: NEGATIVE
Comment: NEGATIVE
Comment: NEGATIVE
Comment: NEGATIVE
Comment: NEGATIVE
Comment: NEGATIVE
Comment: NORMAL
Neisseria Gonorrhea: NEGATIVE
Trichomonas: NEGATIVE

## 2023-12-16 LAB — RPR: RPR Ser Ql: NONREACTIVE

## 2023-12-19 ENCOUNTER — Telehealth: Admitting: Physician Assistant

## 2023-12-19 ENCOUNTER — Telehealth (HOSPITAL_COMMUNITY): Payer: Self-pay | Admitting: *Deleted

## 2023-12-19 ENCOUNTER — Ambulatory Visit (HOSPITAL_COMMUNITY): Payer: Self-pay

## 2023-12-19 DIAGNOSIS — N760 Acute vaginitis: Secondary | ICD-10-CM

## 2023-12-19 MED ORDER — METRONIDAZOLE 500 MG PO TABS: 500.0000 mg | ORAL_TABLET | Freq: Two times a day (BID) | ORAL | 0 refills | Status: AC

## 2023-12-19 MED ORDER — FLUCONAZOLE 150 MG PO TABS: 150.0000 mg | ORAL_TABLET | ORAL | 0 refills | Status: AC

## 2023-12-19 NOTE — Telephone Encounter (Signed)
 Her swab was positive for BV and yeast.  I have sent in metronidazole  to take twice daily for 1 week and Diflucan  to take every 3 days for 3 doses.  Please remind her not to drink alcohol within 72 hours of taking the metronidazole .

## 2023-12-19 NOTE — Telephone Encounter (Signed)
 Pt states when she came in to office on 10/9, for possible exposure to STI.  She states she had no sx when she came into office but she has since seen her results on my chart and is now having sx or itching and irritation and wants flagyl  called in. Pt had a virtual visit this morning and they advised her to be seen in person at urgent care since she has new sx. I have advised pt I will send message to provider and will call her back after reviewed.

## 2023-12-19 NOTE — Progress Notes (Signed)
  Because of persistent/recurring symptoms after recent treatment, I feel your condition warrants further evaluation and I recommend that you be seen in a face-to-face visit.   NOTE: There will be NO CHARGE for this E-Visit   If you are having a true medical emergency, please call 911.     For an urgent face to face visit, [place] has multiple urgent care centers for your convenience.  Click the link below for the full list of locations and hours, walk-in wait times, appointment scheduling options and driving directions:  Urgent Care - [place], [place], [place], [place], [place], [place]  [place]     Your MyChart E-visit questionnaire answers were reviewed by a board certified advanced clinical practitioner to complete your personal care plan based on your specific symptoms.    Thank you for using e-Visits.

## 2023-12-19 NOTE — Telephone Encounter (Signed)
 Called lmom that pt will need to come back in and be seen if she needs med change or still has sx.

## 2023-12-20 NOTE — Telephone Encounter (Signed)
 Called LMOM to advise pt.

## 2024-03-24 ENCOUNTER — Other Ambulatory Visit: Payer: Self-pay

## 2024-03-24 ENCOUNTER — Ambulatory Visit (INDEPENDENT_AMBULATORY_CARE_PROVIDER_SITE_OTHER)

## 2024-03-24 ENCOUNTER — Ambulatory Visit (HOSPITAL_COMMUNITY)
Admission: EM | Admit: 2024-03-24 | Discharge: 2024-03-24 | Disposition: A | Discharge status: Home / Self Care | Attending: Nurse Practitioner | Admitting: Nurse Practitioner

## 2024-03-24 ENCOUNTER — Encounter (HOSPITAL_COMMUNITY): Payer: Self-pay | Admitting: *Deleted

## 2024-03-24 DIAGNOSIS — R079 Chest pain, unspecified: Secondary | ICD-10-CM | POA: Diagnosis not present

## 2024-03-24 NOTE — Discharge Instructions (Addendum)
 Overall, your exam was reassuring.  For any further pain you can  alternate between 500 mg of Tylenol  and 600 mg of ibuprofen  every 4-6 hours.  Follow-up with your primary care provider if pain persist.    Seek immediate care at the nearest emergency department for any new concerning symptoms.

## 2024-03-24 NOTE — ED Triage Notes (Signed)
 PT reports CP for one day.

## 2024-03-24 NOTE — ED Provider Notes (Signed)
 " [facility]    CSN: [id number] Arrival date & time: 03/24/24  1217      History   Chief Complaint Chief Complaint  Patient presents with   Chest Pain    HPI Brianna Mahoney is a 34 y.o. female.   Patient presents to clinic over concern of bilateral chest pain that started last night.  She woke up with and noticed it throughout the night whenever she woke up.  She has not had any wheezing or shortness of breath.  Has not been coughing.  She was sick a few weeks ago with a cold.  Did have a sore throat but this has been gone for the past week or so.  She has not tried any medications or interventions.  -Recent heavy lifting and pain is not reproducible with palpation.  Without history of asthma.  The history is provided by the patient and medical records.  Chest Pain   Past Medical History:  Diagnosis Date   Gestational diabetes     Patient Active Problem List   Diagnosis Date Noted   Iron  deficiency anemia 11/02/2019   S/P cesarean section 11/01/2019   Gestational diabetes mellitus (GDM), antepartum 10/12/2019    Past Surgical History:  Procedure Laterality Date   CESAREAN SECTION     CESAREAN SECTION WITH BILATERAL TUBAL LIGATION Bilateral 11/01/2019   Procedure: CESAREAN SECTION WITH BILATERAL TUBAL LIGATION;  Surgeon: Armond Cape, MD;  Location: [facility];  Service: Obstetrics;  Laterality: Bilateral;    OB History     Gravida  4   Para  4   Term  4   Preterm      AB      Living  4      SAB      IAB      Ectopic      Multiple  0   Live Births  1            Home Medications    Prior to Admission medications  Medication Sig Start Date End Date Taking? Authorizing Provider  acetaminophen  (TYLENOL ) 325 MG tablet Take 2 tablets (650 mg total) by mouth every 4 (four) hours as needed for mild pain (temperature > 101.5.). 11/03/19  Yes Jones, Amanda K, CNM  coconut oil OIL Apply 1 application topically as needed. 11/03/19    Jones, Amanda K, CNM  fluticasone  (FLONASE ) 50 MCG/ACT nasal spray Place 2 sprays into both nostrils daily. 12/26/21   Gladis Elsie BROCKS, PA-C  ibuprofen  (ADVIL ) 600 MG tablet Take 1 tablet (600 mg total) by mouth every 6 (six) hours as needed for moderate pain. 09/12/21   Job Lukes, PA  iron  polysaccharides (NIFEREX) 150 MG capsule Take 1 capsule (150 mg total) by mouth daily. 11/04/19   Joshua Alan POUR, CNM  trimethoprim -polymyxin b  (POLYTRIM ) ophthalmic solution Place 1 drop into both eyes every 4 (four) hours. X 5 days 08/02/22   Vivienne Delon HERO, PA-C    Family History Family History  Problem Relation Age of Onset   Multiple sclerosis Father    Diabetes Maternal Grandmother     Social History Social History[1]   Allergies   Patient has no known allergies.   Review of Systems Review of Systems  Per HPI  Physical Exam Triage Vital Signs ED Triage Vitals  Encounter Vitals Group     BP 03/24/24 1231 111/68     Girls Systolic BP Percentile --      Girls Diastolic BP  Percentile --      Boys Systolic BP Percentile --      Boys Diastolic BP Percentile --      Pulse Rate 03/24/24 1231 62     Resp 03/24/24 1231 18     Temp 03/24/24 1231 98.3 F (36.8 C)     Temp src --      SpO2 03/24/24 1231 98 %     Weight --      Height --      Head Circumference --      Peak Flow --      Pain Score 03/24/24 1228 4     Pain Loc --      Pain Education --      Exclude from Growth Chart --    No data found.  Updated Vital Signs BP 111/68   Pulse 62   Temp 98.3 F (36.8 C)   Resp 18   LMP 03/20/2024   SpO2 98%   Visual Acuity Right Eye Distance:   Left Eye Distance:   Bilateral Distance:    Right Eye Near:   Left Eye Near:    Bilateral Near:     Physical Exam Vitals and nursing note reviewed.  Constitutional:      Appearance: Normal appearance. She is well-developed.  HENT:     Head: Normocephalic and atraumatic.     Right Ear: External ear normal.      Left Ear: External ear normal.     Nose: Nose normal.     Mouth/Throat:     Mouth: Mucous membranes are moist.  Eyes:     Conjunctiva/sclera: Conjunctivae normal.  Cardiovascular:     Rate and Rhythm: Normal rate and regular rhythm.     Heart sounds: Normal heart sounds. No murmur heard. Pulmonary:     Effort: Pulmonary effort is normal. No respiratory distress.     Breath sounds: Normal breath sounds. No wheezing.  Chest:     Chest wall: No tenderness.  Musculoskeletal:        General: No tenderness.  Skin:    General: Skin is warm and dry.  Neurological:     General: No focal deficit present.     Mental Status: She is alert and oriented to person, place, and time.  Psychiatric:        Behavior: Behavior is cooperative.      UC Treatments / Results  Labs (all labs ordered are listed, but only abnormal results are displayed) Labs Reviewed - No data to display  EKG   Radiology No results found.  Procedures Procedures (including critical care time)  Medications Ordered in UC Medications - No data to display  Initial Impression / Assessment and Plan / UC Course  I have reviewed the triage vital signs and the nursing notes.  Pertinent labs & imaging results that were available during my care of the patient were reviewed by me and considered in my medical decision making (see chart for details).  Vitals and triage reviewed, patient is hemodynamically stable.  Lungs vesicular, heart with regular rate and rhythm.  Chest pain is not reproducible with palpation.  EKG by my interpretation shows normal sinus rhythm at a rate of 70 bpm.  Without ST elevation or ST depression.  Chest x-ray by my interpretation does not show cardiopulmonary infiltrate.  Awaiting official radiology overread.  Overall, physical exam is reassuring.  Discussed limitations of urgent care exam.  Strict emergency precautions if symptoms evolve or worsen.  Plan  of care, follow-up care, and return  precautions given, no questions at this time.  Work note provided.    Final Clinical Impressions(s) / UC Diagnoses   Final diagnoses:  Chest pain, unspecified type     Discharge Instructions      Overall, your exam was reassuring.  For any further pain you can  alternate between 500 mg of Tylenol  and 600 mg of ibuprofen  every 4-6 hours.  Follow-up with your primary care provider if pain persist.    Seek immediate care at the nearest emergency department for any new concerning symptoms.     ED Prescriptions   None    PDMP not reviewed this encounter.     [1]  Social History Tobacco Use   Smoking status: Never   Smokeless tobacco: Never  Vaping Use   Vaping status: Former  Substance Use Topics   Alcohol use: Not Currently   Drug use: Yes    Types: Marijuana     Mercer, Chamari Cutbirth  G, FNP 03/24/24 1345  "
